# Patient Record
Sex: Male | Born: 1955 | Race: White | Hispanic: No | Marital: Married | Smoking: Former smoker
Health system: Southern US, Community
[De-identification: ages and names within clinical notes are randomized; demographics above are authoritative.]

## PROBLEM LIST (undated history)

## (undated) DIAGNOSIS — C801 Malignant (primary) neoplasm, unspecified: Secondary | ICD-10-CM

## (undated) DIAGNOSIS — Z951 Presence of aortocoronary bypass graft: Secondary | ICD-10-CM

## (undated) DIAGNOSIS — K633 Ulcer of intestine: Secondary | ICD-10-CM

## (undated) DIAGNOSIS — K635 Polyp of colon: Secondary | ICD-10-CM

## (undated) DIAGNOSIS — R319 Hematuria, unspecified: Secondary | ICD-10-CM

## (undated) DIAGNOSIS — D136 Benign neoplasm of pancreas: Secondary | ICD-10-CM

## (undated) DIAGNOSIS — I214 Non-ST elevation (NSTEMI) myocardial infarction: Secondary | ICD-10-CM

## (undated) DIAGNOSIS — I10 Essential (primary) hypertension: Secondary | ICD-10-CM

## (undated) DIAGNOSIS — I2510 Atherosclerotic heart disease of native coronary artery without angina pectoris: Secondary | ICD-10-CM

## (undated) DIAGNOSIS — N189 Chronic kidney disease, unspecified: Secondary | ICD-10-CM

## (undated) DIAGNOSIS — M5030 Other cervical disc degeneration, unspecified cervical region: Secondary | ICD-10-CM

## (undated) DIAGNOSIS — E785 Hyperlipidemia, unspecified: Secondary | ICD-10-CM

## (undated) DIAGNOSIS — E119 Type 2 diabetes mellitus without complications: Secondary | ICD-10-CM

## (undated) HISTORY — PX: SPLENECTOMY: SUR1306

## (undated) HISTORY — DX: Ulcer of intestine: K63.3

## (undated) HISTORY — PX: DISTAL PANCREATECTOMY: SHX1468

## (undated) HISTORY — DX: Type 2 diabetes mellitus without complications: E11.9

## (undated) HISTORY — DX: Chronic kidney disease, unspecified: N18.9

## (undated) HISTORY — DX: Hematuria, unspecified: R31.9

## (undated) HISTORY — DX: Other cervical disc degeneration, unspecified cervical region: M50.30

## (undated) HISTORY — DX: Hyperlipidemia, unspecified: E78.5

## (undated) HISTORY — DX: Polyp of colon: K63.5

## (undated) HISTORY — DX: Malignant (primary) neoplasm, unspecified: C80.1

## (undated) HISTORY — DX: Essential (primary) hypertension: I10

---

## 1981-10-11 HISTORY — PX: KNEE SURGERY: SHX244

## 1999-10-12 HISTORY — PX: LUMBAR LAMINECTOMY: SHX95

## 2000-09-27 ENCOUNTER — Ambulatory Visit (HOSPITAL_BASED_OUTPATIENT_CLINIC_OR_DEPARTMENT_OTHER): Admission: RE | Admit: 2000-09-27 | Discharge: 2000-09-27 | Payer: Self-pay | Admitting: General Surgery

## 2000-10-11 HISTORY — PX: LUMBAR FUSION: SHX111

## 2007-02-23 ENCOUNTER — Encounter: Admission: RE | Admit: 2007-02-23 | Discharge: 2007-02-23 | Payer: Self-pay | Admitting: Emergency Medicine

## 2007-06-05 ENCOUNTER — Ambulatory Visit (HOSPITAL_COMMUNITY): Admission: RE | Admit: 2007-06-05 | Discharge: 2007-06-05 | Payer: Self-pay | Admitting: Surgery

## 2007-08-07 ENCOUNTER — Encounter: Admission: RE | Admit: 2007-08-07 | Discharge: 2007-08-07 | Payer: Self-pay | Admitting: Orthopedic Surgery

## 2008-10-11 HISTORY — PX: REVISION TOTAL HIP ARTHROPLASTY: SHX766

## 2009-09-18 HISTORY — PX: COLONOSCOPY: SHX174

## 2011-02-23 NOTE — Op Note (Signed)
Timothy Gentry, Timothy Gentry               ACCOUNT NO.:  [id number]   MEDICAL RECORD NO.:  [id number]          PATIENT TYPE:  AMB   LOCATION:  DAY                          FACILITY:  [facility]   PHYSICIAN:  Sandria Bales. Ezzard Standing, M.D.  DATE OF BIRTH:  Jul 03, 1956   DATE OF PROCEDURE:  06/05/2007  DATE OF DISCHARGE:                               OPERATIVE REPORT   PREOPERATIVE DIAGNOSIS:  Bilateral inguinal hernias.   POSTOPERATIVE DIAGNOSIS:  Bilateral direct inguinal hernias, left larger  than right.   PROCEDURE:  Bilateral laparoscopic inguinal hernia repair, with 3-D  Davol mesh.   SURGEON:  Sandria Bales. Ezzard Standing, M.D.   ANESTHESIA:  General endotracheal.   ESTIMATED BLOOD LOSS:  Minimal.   PROCEDURE:  Timothy Gentry is a 55 year old white male, a patient of Dr.  Leslee Home, who has had bilateral inguinal hernias (the larger one on  the left side).  These have both been repaired before, most recently by  Francina Ames, M.D. in December 2001; who used an onlay Marlex mesh.  He  now comes for attempted laparoscopic inguinal hernia repair.   The indications and potential complications of the procedure were  explained to the patient.  Potential complications include, but not  limited to:  bleeding, infection, nerve injury and recurrence of the  hernia.   OPERATIVE NOTE:  Patient was placed in the supine position, given a  general anesthetic.  He was given 1 gram of Ancef initially at this  procedure.  A time-out was held, identifying both the procedure and the  patient.   He had a Foley placed, and his lower abdomen was shaved, prepped with  Betadine solution and sterilely draped.   An infraumbilical incision was made with sharp dissection carried down  into the rectus abdominis muscle.  I made an incision through the  anterior rectus abdominis on the left side and retracted the rectus  abdominal muscle anteriorly; and used the [place] surgical preperitoneal  balloon to dissect down to his pubic  tubercle.   I then blew this balloon up under direct laparoscopic visualization,  identifying the peritoneum being pushed posteriorly and the rectus  abdominis muscle anteriorly.  After the balloon had a pretty good  dissection, I then removed the balloon.  I placed two 5 mm trocars; one  in the left lower and one in the right lower quadrant.   I then carried out the dissection; identified the pubic bone and  Cooper's ligament, identified the inferior epigastric vessels.  He had  bilateral inguinal hernias.  There was a fairly large direct hernia on  the left side, and a kind of a lateral inguinal floor small hernia on  the right side and the lateral inguinal floor.  Both were direct  hernias.  I identified again his inferior epigastric vessels.  I  skeletonized his cord structures on both sides.  He had no evidence of  an indirect hernia.  I did encircle both  cord structures and identified  the peritoneum  along the cord structures on both sides.   I then carried out the bilateral inguinal floor repair  using 2 pieces of  Davol  3-D mesh.  I tacked the mesh on both sides in a mirror fashion to  the pubic tubercle medially, Cooper's ligament inferiorly, transversalis  fascia superiorly and laterally.  I avoided the area lateral to the  iliac vessels and inferior to the iliopubic tract.   The mesh on both sides lay flat and covered the defect well.  Then,  prior to deflating the abdomen, I held the corners of the mesh where I  could not put tacks.   I then removed the trocars in turn, closed the anterior fascia with a 2-  0 Vicryl suture on the left side.  I closed the skin with a 5-0 Vicryl.  Painted the wounds with the tincture of Benzoin and Steri-Strips.  The  sponge and needle count were correct.   The patient tolerated the procedure well and was transported to the  recovery room in good condition.      Sandria Bales. Ezzard Standing, M.D.  Electronically Signed     DHN/MEDQ  D:   06/05/2007  T:  06/05/2007  Job:  [id number]   cc:   Reuben Likes, M.D.  Fax: [phone]   Griffith Citron, M.D.  Fax: [phone]

## 2011-02-26 NOTE — Op Note (Signed)
[place]  Patient:    Timothy Gentry, Timothy Gentry                      MRN: [id number] Proc. Date: 09/27/00 Adm. Date:  [id number] Attending:  Janalyn Rouse                           Operative Report  PREOPERATIVE DIAGNOSIS:  Bilateral inguinal hernias with an epigastric hernia.  POSTOPERATIVE DIAGNOSIS:  Bilateral direct inguinal hernias with epigastric hernia.  PROCEDURE:  Repair of bilateral inguinal hernias with Marlex mesh and epigastric hernia with mesh.  SURGEON:  Rose Phi. Maple Hudson, M.D.  ANESTHESIA:  General.  DESCRIPTION OF PROCEDURE:  After suitable general anesthesia was induced, the patient was placed in the supine position and the entire abdomen prepped and draped in the usual fashion.  The right inguinal incision was approached first with an oblique incision down to the fascia of the external oblique. Hemostasis obtained with the cautery.  External oblique fascia was incised down through the external ring.  Spermatic cord was mobilized and a drain placed about it.  He had a fairly large direct sac, which I reduced, and then closed that defect with a 2-0 Vicryl suture just to hold it out of the way. There was a medium-sized lipoma of the cord, which we excised, and there was no indirect sac.  We then took an appropriately-sized Marlex mesh and placed it on the floor of the canal with the lateral margin incised to go around the cord.  We then sutured it in place in a tension-free fashion with interrupted 2-0 Prolene sutures, including suturing the tail of the mesh lateral to the cord.  External oblique fascia was then closed over the cord with running 3-0 Vicryl.  Vicryl 3-0 used in the subcutaneous tissue and staples for use in the skin.  Left side was then repaired basically in an identical fashion, and the only difference was that there was no lipoma of the cord and that the direct defect was somewhat smaller than on the right  side.  After completing the closure of the left side, then made a vertical incision above the umbilicus and dissected down to the fascia and opened up the hernia sac, which may have extended from the trocar site from his previous laparoscopic cholecystectomy.  We completely reduced the hernia and had to excise a little omentum.  With clean margins, I closed the fascia in a vertical fashion with interrupted 0 Prolene sutures, and then we took Marlex mesh and placed it on the fascia and sutured it in place with 2-0 Prolene. Following this, subcutaneous tissue was closed with 3-0 Vicryl and the skin with staples.  Dressings were then applied and the patient transferred to the recovery room in satisfactory condition, having tolerated the procedure well. DD:  09/27/00 TD:  09/28/00 Job: [id number]

## 2011-07-23 LAB — DIFFERENTIAL
Basophils Relative: 1
Eosinophils Absolute: 0.2
Lymphs Abs: 1.5
Monocytes Absolute: 0.5
Neutro Abs: 4.2

## 2011-07-23 LAB — COMPREHENSIVE METABOLIC PANEL
AST: 18
Albumin: 3.4 — ABNORMAL LOW
Alkaline Phosphatase: 70
BUN: 13
CO2: 28
GFR calc Af Amer: >60
GFR calc non Af Amer: 59 — ABNORMAL LOW
Sodium: 141
Total Bilirubin: 0.8

## 2011-07-23 LAB — CBC
Hemoglobin: 14.6
MCHC: 34.1
RBC: 5.32
WBC: 6.4

## 2014-03-15 ENCOUNTER — Other Ambulatory Visit: Payer: Self-pay | Admitting: Family Medicine

## 2014-03-15 DIAGNOSIS — R1904 Left lower quadrant abdominal swelling, mass and lump: Secondary | ICD-10-CM

## 2014-03-20 ENCOUNTER — Ambulatory Visit
Admission: RE | Admit: 2014-03-20 | Discharge: 2014-03-20 | Disposition: A | Discharge status: Home / Self Care | Payer: 59 | Source: Ambulatory Visit | Attending: Family Medicine | Admitting: Family Medicine

## 2014-03-20 ENCOUNTER — Other Ambulatory Visit: Payer: Self-pay | Admitting: Family Medicine

## 2014-03-20 DIAGNOSIS — R1904 Left lower quadrant abdominal swelling, mass and lump: Secondary | ICD-10-CM

## 2014-03-20 DIAGNOSIS — K869 Disease of pancreas, unspecified: Secondary | ICD-10-CM

## 2014-03-20 MED ORDER — IOHEXOL 300 MG/ML  SOLN
125.0000 mL | Freq: Once | INTRAMUSCULAR | Status: AC | PRN
  Administered 2014-03-20: 125 mL via INTRAVENOUS

## 2014-03-22 ENCOUNTER — Ambulatory Visit
Admission: RE | Admit: 2014-03-22 | Discharge: 2014-03-22 | Disposition: A | Discharge status: Home / Self Care | Payer: 59 | Source: Ambulatory Visit | Attending: Family Medicine | Admitting: Family Medicine

## 2014-03-22 DIAGNOSIS — K869 Disease of pancreas, unspecified: Secondary | ICD-10-CM

## 2014-03-22 MED ORDER — GADOBENATE DIMEGLUMINE 529 MG/ML IV SOLN
20.0000 mL | Freq: Once | INTRAVENOUS | Status: AC | PRN
  Administered 2014-03-22: 20 mL via INTRAVENOUS

## 2014-03-27 ENCOUNTER — Encounter (INDEPENDENT_AMBULATORY_CARE_PROVIDER_SITE_OTHER): Payer: Self-pay

## 2014-03-29 ENCOUNTER — Encounter (INDEPENDENT_AMBULATORY_CARE_PROVIDER_SITE_OTHER): Payer: Self-pay | Admitting: General Surgery

## 2014-03-29 ENCOUNTER — Other Ambulatory Visit (INDEPENDENT_AMBULATORY_CARE_PROVIDER_SITE_OTHER): Payer: Self-pay | Admitting: General Surgery

## 2014-03-29 ENCOUNTER — Ambulatory Visit (INDEPENDENT_AMBULATORY_CARE_PROVIDER_SITE_OTHER): Payer: 59 | Admitting: General Surgery

## 2014-03-29 VITALS — BP 130/90 | HR 86 | Temp 97.8°F | Resp 16 | Ht 70.0 in | Wt 242.6 lb

## 2014-03-29 DIAGNOSIS — C252 Malignant neoplasm of tail of pancreas: Secondary | ICD-10-CM

## 2014-03-29 DIAGNOSIS — D136 Benign neoplasm of pancreas: Secondary | ICD-10-CM | POA: Insufficient documentation

## 2014-03-29 DIAGNOSIS — K8689 Other specified diseases of pancreas: Secondary | ICD-10-CM

## 2014-03-29 HISTORY — DX: Benign neoplasm of pancreas: D13.6

## 2014-03-29 NOTE — Assessment & Plan Note (Signed)
Will get CT chest, CA 19-9, and endoscopic ultrasound.    Will also schedule surgery in order to avoid delays This does appear to be a pancreatic cancer.    I reviewed surgery with patient.  I discussed the procedure, as well as the risks.  I reviewed the risks of recurrent cancer, metastatic cancer requiring surgery abort.  I discussed the risks of panc leak, damage to adjacent structures, worsening of diabetes, need for panc enzyme supplementation, possible need for additional procedures/chemo/radiation.    45 min spent in evaluation, examination, counseling, and coordination of care.  >50% spent in counseling.

## 2014-03-29 NOTE — Progress Notes (Signed)
Chief Complaint  Patient presents with  . Mass    pancreas    HISTORY: Patient is a 58 year old male who presents with an incidentally discovered malignant appearing pancreatic tail mass. He is here for consultation at the request of Dr. Sandi Mariscal.  He felt a soft abdominal wall mass when he stood up that was just lateral to a previous umbilical hernia repair. This has been there for several years. He finally brought him to his primary care physician who ordered a CT scan. Unfortunately, the CT scan demonstrated a possible lesion in the pancreatic tail. An MRI was requested for better lineation of this region. MRI did confirm that there was a mass that appeared to be invading the splenic vein.  He is completely asymptomatic. He has no nausea, vomiting, early satiety, weight loss, or diarrhea. He has had a slight difficulty with his blood sugars in the past 2-3 years. He has been started on metformin.  His father had multiple myeloma, but he has no other family history of cancer. He is a former smoker. He drinks around one to 2 beers per week. He does not use any drugs. He does office work.  Past Medical History  Diagnosis Date  . Hematuria   . Diabetes mellitus without complication     Type II  . Hyperlipidemia   . Hypertension     Not adequately controlled  . Colon polyps   . Degenerative disc disease, cervical   . Chronic kidney disease     Chronic, Stage III  . Colon ulcer     Possible NSAID induced  . Cancer     skin    Past Surgical History  Procedure Laterality Date  . Revision total hip arthroplasty  2010  . Colon surgery  09/18/2009    colonoscopy  . Knee surgery Right 1983    Open repair of meniscus of knee  . Lumbar laminectomy  2001  . Lumbar fusion  2002    Current Outpatient Prescriptions  Medication Sig Dispense Refill  . allopurinol (ZYLOPRIM) 100 MG tablet Take 100 mg by mouth daily.      Marland Kitchen allopurinol (ZYLOPRIM) 300 MG tablet Take 300 mg by mouth daily.       Marland Kitchen aspirin 81 MG tablet Take 81 mg by mouth daily.      . metFORMIN (GLUCOPHAGE-XR) 500 MG 24 hr tablet       . Multiple Vitamin (MULTIVITAMIN) tablet Take 1 tablet by mouth daily.      . pravastatin (PRAVACHOL) 80 MG tablet       . valsartan-hydrochlorothiazide (DIOVAN-HCT) 320-25 MG per tablet Take 1 tablet by mouth daily.       No current facility-administered medications for this visit.     No Known Allergies   Family History  Problem Relation Age of Onset  . Cancer Father     myoloma     History   Social History  . Marital Status: Married    Spouse Name: N/A    Number of Children: N/A  . Years of Education: N/A   Social History Main Topics  . Smoking status: Former Smoker    Types: Cigarettes    Quit date: 03/30/1979  . Smokeless tobacco: None  . Alcohol Use: Yes  . Drug Use: No  . Sexual Activity: None   Other Topics Concern  . None   Social History Narrative  . None     REVIEW OF SYSTEMS - PERTINENT POSITIVES ONLY: 12 point review of  systems negative other than HPI and PMH.  EXAM: Filed Vitals:   03/29/14 0938  BP: 130/90  Pulse: 86  Temp: 97.8 F (36.6 C)  Resp: 16    Wt Readings from Last 3 Encounters:  03/29/14 242 lb 9.6 oz (110.043 kg)     Gen:  No acute distress.  Well nourished and well groomed.   Neurological: Alert and oriented to person, place, and time. Coordination normal.  Head: Normocephalic and atraumatic.  Eyes: Conjunctivae are normal. Pupils are equal, round, and reactive to light. No scleral icterus.  Neck: Normal range of motion. Neck supple. No tracheal deviation or thyromegaly present.  Cardiovascular: Normal rate, regular rhythm, normal heart sounds and intact distal pulses.  Exam reveals no gallop and no friction rub.  No murmur heard. Respiratory: Effort normal.  No respiratory distress. No chest wall tenderness. Breath sounds normal.  No wheezes, rales or rhonchi.  GI: Soft. Bowel sounds are normal. The abdomen is  soft and nontender.  There is no rebound and no guarding.  There is a soft mass appreciable in the left mid abdomen around 8 cm while he is standing.  .  This is fatty.  It is less appreciable upon lying down.  Musculoskeletal: Normal range of motion. Extremities are nontender.  Lymphadenopathy: No cervical, preauricular, postauricular or axillary adenopathy is present Skin: Skin is warm and dry. No rash noted. No diaphoresis. No erythema. No pallor. No clubbing, cyanosis, or edema.   Psychiatric: Normal mood and affect. Behavior is normal. Judgment and thought content normal.    LABORATORY RESULTS: Available labs are reviewed   No results found for this or any previous visit (from the past 2160 hour(s)).   RADIOLOGY RESULTS: See E-Chart or I-Site for most recent results.  Images and reports are reviewed.  Mr Abdomen W Wo Contrast  03/22/2014   CLINICAL DATA:  Evaluate mass within tail of pancreas  EXAM: MRI ABDOMEN WITHOUT AND WITH CONTRAST  TECHNIQUE: Multiplanar multisequence MR imaging of the abdomen was performed both before and after the administration of intravenous contrast.  CONTRAST:  57m MULTIHANCE GADOBENATE DIMEGLUMINE 529 MG/ML IV SOLN  COMPARISON:  CT 03/20/2014  FINDINGS: Mass within the tail of pancreas is identified:  Size: 2.7 x 3.7 x 2.7  Location: Involves the tail of pancreas.  Characterization: Predominantly solid with internal cystic component measuring 1.1 cm.  Enhancement: This exhibits hyper to isointense enhancement compared to adjacent normal pancreas parenchyma.  Other Characteristics: None  Local extent of mass: None  Vascular Involvement: This abuts the distal portion of the splenic vein. There is no involvement of the SMA, celiac artery or portal vein.  Variant hepatic artery anatomy: None  Bile Duct Involvement: None  Variant biliary anatomy: Not applicable  Adjacent Nodes: None  Omental/Peritoneal Disease: None  Distant Metastases: None  Other: No focal liver  abnormalities identified. Prior cholecystectomy. The spleen appears normal. The adrenal glands are both normal. Bosniak category 1 cysts are identified within the upper pole of the left kidney. Stone within the upper pole of the left kidney is again noted.  Normal caliber of the abdominal aorta.  No aneurysm.  IMPRESSION: 1. Suspicious mass within tail of pancreas is concerning for pancreatic adenocarcinoma. 2. No evidence for metastatic disease.   Electronically Signed   By: TKerby MoorsM.D.   On: 03/22/2014 13:11   Ct Abdomen Pelvis W Contrast  03/20/2014   CLINICAL DATA:  Periumbilical mass.  EXAM: CT ABDOMEN AND PELVIS WITH CONTRAST  TECHNIQUE: Multidetector CT imaging of the abdomen and pelvis was performed using the standard protocol following bolus administration of intravenous contrast.  CONTRAST:  163m OMNIPAQUE IOHEXOL 300 MG/ML  SOLN  COMPARISON:  None.  FINDINGS: Liver normal. Spleen normal. Questionable low-density lesion in the tail of the pancreas noted, image number 8/series 5. MRI of the pancreas is suggested for further evaluation. No biliary distention. Surgical clips gallbladder fossa.  Adrenals normal. Bilateral nonobstructing nephrolithiasis. Multiple simple cyst left kidney. No evidence of ureteral stone. The bladder is nondistended. Calcification of the prostate. No pelvic mass or fluid collection.  No significant inguinal or retroperitoneal lymph nodes are noted. Abdominal aorta is widely patent. Visceral vessels are patent.  Appendix normal. No inflammatory change in the right or left lower quadrant. Stool is present colon. Colonic diverticulosis. No evidence of diverticulitis. No free air. No mesenteric mass. Anterior abdominal wall hernia to the left of midline is noted. There is herniation of fat only. Bilateral inguinal hernias with herniation of fat noted.  Mild basilar atelectasis. Mild cardiomegaly. Coronary artery disease cannot be excluded. No acute bony abnormalities.  Postsurgical changes lumbar spine and left hip. Degenerative changes lumbar spine.  IMPRESSION: 1. Anterior abdominal wall hernia to the left of midline with herniation of fat only. Small bilateral inguinal hernias.  2. Possible pancreatic tail mass. MRI of the pancreas suggest for further evaluation .  3.  Mild cardiomegaly.  Coronary artery disease cannot be excluded .   Electronically Signed   By: TMarcello Moores Register   On: 03/20/2014 13:24      ASSESSMENT AND PLAN: Malignant neoplasm of tail of pancreas Will get CT chest, CA 19-9, and endoscopic ultrasound.    Will also schedule surgery in order to avoid delays This does appear to be a pancreatic cancer.    I reviewed surgery with patient.  I discussed the procedure, as well as the risks.  I reviewed the risks of recurrent cancer, metastatic cancer requiring surgery abort.  I discussed the risks of panc leak, damage to adjacent structures, worsening of diabetes, need for panc enzyme supplementation, possible need for additional procedures/chemo/radiation.    45 min spent in evaluation, examination, counseling, and coordination of care.  >50% spent in counseling.       FMilus HeightMD Surgical Oncology, General [facility]      Visit Diagnoses: 1. Malignant neoplasm of tail of pancreas     Primary Care Physician: BMarylene Land MD

## 2014-03-29 NOTE — Patient Instructions (Signed)
We will order CT chest, CA 19-9, and endoscopic ultrasound.    I will see you back in around 2-3 weeks.    I will go ahead and try to find a surgical time.  We can cancel or change this if we need to, or if you desire.  However, it will be much later if we don't go ahead and get a date.    Pancreas Surgery  THE OPERATION: The goal of the operation is to remove masses in the tail of the pancreas.  The distal pancreas and spleen will be removed.  The reason so much needs to be removed is that the blood supply and lymphatic channels and nodes are closely interconnected.  The operation takes between 2-4 hours depending on the amount of scar tissue you have present from prior surgeries, or from the mass.    WHAT HAPPENS IN THE OPERATING ROOM: I will start the operation with a diagnostic laparoscopy.  This means we will make small incisions to see if there is visible cancer outside of the pancreas.  If there is visible spread of cancer, I will stop the operation because research has shown that survival for pancreatic cancer is worse if you undergo a big operation without being able to remove all the cancer.  If no other cancer is seen, I will proceed with the rest of the operation.  You will have an incision in the midline around 3-4 inches, and several other incisions in the left upper abdomen.  Whatever the operative findings, I will discuss the case with your family after we are done in the operating room.  I will talk to you in the next few days when you are more awake.  I will see you in the hospital every weekday that I am not out of town.  My partners help see patients on the weekends and if I am out of town.    WHAT HAPPENS AFTER SURGERY: After surgery, you will go first to the recovery room,then to an appropriate level of room.  You will have many tubes and lines in place which is standard for this operation.   You will have a tube in your nose for 1-2 days in order to suction out the stomach.   YOU WILL NOT BE ABLE TO EAT FOR SEVERAL DAYS AFTER SURGERY.  You will have a catheter in your bladder.  On your abdomen, you will have a surgical drains and possibly a pain pump with numbing medicine.  You will have compression stockings on your legs to decrease the risk of blood clots.    We will address your pain in several ways.  We will use an IV pain pump called a "PCA," or Patient Controlled Analgesia.  This allows you to press a button and immediately receive a dose of pain medication without waiting for a nurse.  We also use IV Tylenol and sometimes IV Toradol which is similar to ibuprofen.  You may also have a pump with numbing medicine delivered directly to your incision.  I use doses and medications that work for the majority of people, but you may need an adjustment to the dose or type of medicine if your pain is not adequately controlled.  Your throat may be sore, in which case you may need a throat spray or lozenges.    We will ask you to get out of bed the day after surgery in order to maximize your chances of not having complications.  Your risk of  pneumonia and blood clots is lower with walking and sitting in the chair.  We will also ask you to perform breathing exercises.  We will also ask to you walk in your room and in the halls for the above reasons, but also in order for you to keep up your strength.    EATING: We will usually start you on clear liquids in around 1-2 days if your bowel function seems to have returned.  We advance your diet slowly to make sure you are tolerating each step.  All patients do not have a normal appetite when they go home Most patients also find that their taste buds do not seem the same right after surgery, and this can continue into the time of possible post operative chemotherapy and radiation.  Some patients develop diabetes and will need assistance from a primary care doctor for medication.    OTHER TREATMENT? I will present your case when the  pathology is available at our GI Multidisciplinary conference which occurs every 1-2 weeks.  Medical and Radiation Oncologists are present, as well as the pathologist and radiologist in addition to myself.  If you have a larger tumor or if you have positive lymph nodes, we may have the oncologist stop by to see you while you are in the hospital.  Most firm post op treatment plans occur, however, once you are an outpatient because we will need to see how you are recovering from surgery.  GOING HOME! Usually you are able to go home in 3-7 days, depending on whether or not complications happen and what is going on with your overall health status.   If you have more health problems or if you have limited help at home, the therapists and nurses may recommend a temporary rehab or nursing facility to help you get back on your feet before you go home.  These decisions would be made while you are in the hospital with the assistance of a social worker or case manager.    Please bring all insurance/disability forms to our office for the staff to fill out   POSSIBLE COMPLICATIONS This is a very extensive operation and includes complications listed below: Bleeding Infection and possible wound complications such as hernia Damage to adjacent structures Pancreatic leak (10%) Possible need for other procedures, such as abscess drains in radiology or endoscopy.   Possible prolonged hospital stay Possible development of diabetes or worsening of current diabetes. (5-20%) Possible diarrhea from lack of pancreatic enzymes.   Prolonged fatigue/weakness/appetite MOST PATIENTS' ENERGY LEVEL IS NOT BACK TO NORMAL FOR AT LEAST 2-3 MONTHS.  OLDER PATIENTS MAY FEEL WEAK FOR LONGER PERIODS OF TIME.   Possible early recurrence of cancer Possible complications of your medical problems such as heart disease or arrhythmias. Death (less than 1%)  All possible complications are not listed, just the most common.    FURTHER  INFORMATION? Please ask questions if you find something that we did not discuss in the office and would like more information.  If you would like another appointment if you have many questions or if your family members would like to come as well, please contact the office.    IF YOU ARE TAKING ASPIRIN, PLAVIX, COUMADIN, OR OTHER BLOOD THINNERS, LET us KNOW IMMEDIATELY SO WE CAN CONTACT YOUR PRESCRIBING HEALTH CARE PROVIDER TO HOLD THE MEDICATION FOR 5-7 DAYS BEFORE SURGERY

## 2014-04-02 ENCOUNTER — Telehealth: Payer: Self-pay | Admitting: Gastroenterology

## 2014-04-02 ENCOUNTER — Other Ambulatory Visit: Payer: Self-pay

## 2014-04-02 DIAGNOSIS — K8689 Other specified diseases of pancreas: Secondary | ICD-10-CM

## 2014-04-02 NOTE — Telephone Encounter (Signed)
Per [facility] pt appt has been moved to 04/25/14 the pt is out of town, instructions will be mailed to the pt home

## 2014-04-02 NOTE — Telephone Encounter (Signed)
Left message on machine to call back  

## 2014-04-02 NOTE — Telephone Encounter (Signed)
Pt has been scheduled and EUS instructions have been mailed to the home. Anderson Malta with [facility] will have pt call me when she speaks to him.

## 2014-04-02 NOTE — Telephone Encounter (Signed)
Patty, he needs upper EUS, radial + linear, this Thursday, ++MAC, for pancreatic tail mass  Thanks

## 2014-04-02 NOTE — Telephone Encounter (Signed)
Pt needs instructions for 04/04/14 EUS

## 2014-04-03 ENCOUNTER — Telehealth: Payer: Self-pay | Admitting: Gastroenterology

## 2014-04-03 NOTE — Telephone Encounter (Signed)
Ok, thanks  Dillwyn, I'm happy to reschedule EUS if he changes his mind

## 2014-04-03 NOTE — Telephone Encounter (Signed)
The appt has been cx, the has notified Dr Barry Dienes he would only state that he was going to do something else.

## 2014-04-15 ENCOUNTER — Encounter (INDEPENDENT_AMBULATORY_CARE_PROVIDER_SITE_OTHER): Payer: 59 | Admitting: General Surgery

## 2014-04-16 ENCOUNTER — Other Ambulatory Visit: Payer: 59

## 2014-04-25 ENCOUNTER — Encounter (HOSPITAL_COMMUNITY): Admission: RE | Payer: Self-pay | Source: Ambulatory Visit

## 2014-04-25 ENCOUNTER — Ambulatory Visit (HOSPITAL_COMMUNITY): Admission: RE | Admit: 2014-04-25 | Payer: 59 | Source: Ambulatory Visit | Admitting: Gastroenterology

## 2014-04-25 SURGERY — UPPER ENDOSCOPIC ULTRASOUND (EUS) LINEAR
Anesthesia: Monitor Anesthesia Care

## 2014-04-29 ENCOUNTER — Inpatient Hospital Stay (HOSPITAL_COMMUNITY): Admission: RE | Admit: 2014-04-29 | Payer: 59 | Source: Ambulatory Visit | Admitting: General Surgery

## 2014-04-29 ENCOUNTER — Encounter (HOSPITAL_COMMUNITY): Admission: RE | Payer: Self-pay | Source: Ambulatory Visit

## 2014-04-29 SURGERY — PANCREATECTOMY, LAPAROSCOPIC
Anesthesia: General

## 2014-05-17 ENCOUNTER — Encounter (INDEPENDENT_AMBULATORY_CARE_PROVIDER_SITE_OTHER): Payer: 59 | Admitting: General Surgery

## 2014-09-22 ENCOUNTER — Inpatient Hospital Stay (HOSPITAL_COMMUNITY)
Admission: EM | Admit: 2014-09-22 | Discharge: 2014-09-24 | DRG: 379 | Disposition: A | Discharge status: Home / Self Care | Payer: 59 | Attending: Internal Medicine | Admitting: Internal Medicine

## 2014-09-22 ENCOUNTER — Encounter (HOSPITAL_COMMUNITY): Payer: Self-pay | Admitting: *Deleted

## 2014-09-22 ENCOUNTER — Emergency Department (HOSPITAL_COMMUNITY): Payer: 59

## 2014-09-22 DIAGNOSIS — E119 Type 2 diabetes mellitus without complications: Secondary | ICD-10-CM | POA: Diagnosis present

## 2014-09-22 DIAGNOSIS — I129 Hypertensive chronic kidney disease with stage 1 through stage 4 chronic kidney disease, or unspecified chronic kidney disease: Secondary | ICD-10-CM | POA: Diagnosis present

## 2014-09-22 DIAGNOSIS — Z9049 Acquired absence of other specified parts of digestive tract: Secondary | ICD-10-CM | POA: Diagnosis present

## 2014-09-22 DIAGNOSIS — I959 Hypotension, unspecified: Secondary | ICD-10-CM | POA: Diagnosis present

## 2014-09-22 DIAGNOSIS — K449 Diaphragmatic hernia without obstruction or gangrene: Secondary | ICD-10-CM | POA: Diagnosis present

## 2014-09-22 DIAGNOSIS — Z7982 Long term (current) use of aspirin: Secondary | ICD-10-CM

## 2014-09-22 DIAGNOSIS — Z86010 Personal history of colonic polyps: Secondary | ICD-10-CM

## 2014-09-22 DIAGNOSIS — N183 Chronic kidney disease, stage 3 (moderate): Secondary | ICD-10-CM | POA: Diagnosis present

## 2014-09-22 DIAGNOSIS — Z79899 Other long term (current) drug therapy: Secondary | ICD-10-CM

## 2014-09-22 DIAGNOSIS — I10 Essential (primary) hypertension: Secondary | ICD-10-CM

## 2014-09-22 DIAGNOSIS — K625 Hemorrhage of anus and rectum: Principal | ICD-10-CM

## 2014-09-22 DIAGNOSIS — Z981 Arthrodesis status: Secondary | ICD-10-CM | POA: Diagnosis not present

## 2014-09-22 DIAGNOSIS — E875 Hyperkalemia: Secondary | ICD-10-CM | POA: Diagnosis present

## 2014-09-22 DIAGNOSIS — K648 Other hemorrhoids: Secondary | ICD-10-CM | POA: Diagnosis present

## 2014-09-22 DIAGNOSIS — R55 Syncope and collapse: Secondary | ICD-10-CM

## 2014-09-22 DIAGNOSIS — E785 Hyperlipidemia, unspecified: Secondary | ICD-10-CM

## 2014-09-22 DIAGNOSIS — Z87891 Personal history of nicotine dependence: Secondary | ICD-10-CM

## 2014-09-22 DIAGNOSIS — Z96649 Presence of unspecified artificial hip joint: Secondary | ICD-10-CM | POA: Diagnosis present

## 2014-09-22 LAB — URINALYSIS, ROUTINE W REFLEX MICROSCOPIC
Bilirubin Urine: NEGATIVE
Glucose, UA: NEGATIVE mg/dL
Hgb urine dipstick: NEGATIVE
Ketones, ur: NEGATIVE mg/dL
Leukocytes, UA: NEGATIVE
Nitrite: NEGATIVE
Protein, ur: NEGATIVE mg/dL
SPECIFIC GRAVITY, URINE: 1.011 (ref 1.005–1.030)
UROBILINOGEN UA: 0.2 mg/dL (ref 0.0–1.0)
pH: 6.5 (ref 5.0–8.0)

## 2014-09-22 LAB — TROPONIN I: Troponin I: <0.3 ng/mL (ref <0.30)

## 2014-09-22 LAB — COMPREHENSIVE METABOLIC PANEL
ALK PHOS: 75 U/L (ref 39–117)
ALT: 14 U/L (ref 0–53)
ANION GAP: 12 (ref 5–15)
AST: 23 U/L (ref 0–37)
Albumin: 3.1 g/dL — ABNORMAL LOW (ref 3.5–5.2)
BILIRUBIN TOTAL: 0.3 mg/dL (ref 0.3–1.2)
BUN: 26 mg/dL — AB (ref 6–23)
CHLORIDE: 101 meq/L (ref 96–112)
CO2: 23 meq/L (ref 19–32)
Calcium: 9.4 mg/dL (ref 8.4–10.5)
Creatinine, Ser: 1.07 mg/dL (ref 0.50–1.35)
GFR, EST AFRICAN AMERICAN: 87 mL/min — AB (ref >90)
GFR, EST NON AFRICAN AMERICAN: 75 mL/min — AB (ref >90)
GLUCOSE: 179 mg/dL — AB (ref 70–99)
POTASSIUM: 5.4 meq/L — AB (ref 3.7–5.3)
Sodium: 136 mEq/L — ABNORMAL LOW (ref 137–147)
Total Protein: 6.6 g/dL (ref 6.0–8.3)

## 2014-09-22 LAB — TYPE AND SCREEN
ABO/RH(D): A POS
ANTIBODY SCREEN: NEGATIVE

## 2014-09-22 LAB — CBC WITH DIFFERENTIAL/PLATELET
BASOS PCT: 0 % (ref 0–1)
Basophils Absolute: 0.1 10*3/uL (ref 0.0–0.1)
EOS PCT: 1 % (ref 0–5)
Eosinophils Absolute: 0.2 10*3/uL (ref 0.0–0.7)
HCT: 42 % (ref 39.0–52.0)
Hemoglobin: 13.3 g/dL (ref 13.0–17.0)
LYMPHS PCT: 15 % (ref 12–46)
Lymphs Abs: 2.5 10*3/uL (ref 0.7–4.0)
MCH: 26.2 pg (ref 26.0–34.0)
MCHC: 31.7 g/dL (ref 30.0–36.0)
MCV: 82.8 fL (ref 78.0–100.0)
MONO ABS: 1.2 10*3/uL — AB (ref 0.1–1.0)
Monocytes Relative: 7 % (ref 3–12)
NEUTROS PCT: 77 % (ref 43–77)
Neutro Abs: 12.8 10*3/uL — ABNORMAL HIGH (ref 1.7–7.7)
Platelets: 354 10*3/uL (ref 150–400)
RBC: 5.07 MIL/uL (ref 4.22–5.81)
RDW: 17 % — ABNORMAL HIGH (ref 11.5–15.5)
WBC: 16.7 10*3/uL — AB (ref 4.0–10.5)

## 2014-09-22 LAB — HEMOGLOBIN AND HEMATOCRIT, BLOOD
HCT: 37.6 % — ABNORMAL LOW (ref 39.0–52.0)
HCT: 35.7 % — ABNORMAL LOW (ref 39.0–52.0)
HEMATOCRIT: 36.8 % — AB (ref 39.0–52.0)
Hemoglobin: 12 g/dL — ABNORMAL LOW (ref 13.0–17.0)
Hemoglobin: 11.7 g/dL — ABNORMAL LOW (ref 13.0–17.0)
Hemoglobin: 11.3 g/dL — ABNORMAL LOW (ref 13.0–17.0)

## 2014-09-22 LAB — ABO/RH: ABO/RH(D): A POS

## 2014-09-22 LAB — POC OCCULT BLOOD, ED: FECAL OCCULT BLD: POSITIVE — AB

## 2014-09-22 LAB — MRSA PCR SCREENING: MRSA BY PCR: NEGATIVE

## 2014-09-22 MED ORDER — ONDANSETRON HCL 4 MG/2ML IJ SOLN: 4.0000 mg | Freq: Four times a day (QID) | INTRAMUSCULAR | Status: DC | PRN

## 2014-09-22 MED ORDER — SODIUM CHLORIDE 0.9 % IV SOLN
INTRAVENOUS | Status: DC
  Administered 2014-09-22: 13:00:00 via INTRAVENOUS

## 2014-09-22 MED ORDER — SODIUM CHLORIDE 0.9 % IV SOLN
Freq: Once | INTRAVENOUS | Status: AC
  Administered 2014-09-22: 06:00:00 via INTRAVENOUS

## 2014-09-22 MED ORDER — PRAVASTATIN SODIUM 80 MG PO TABS
80.0000 mg | ORAL_TABLET | Freq: Every evening | ORAL | Status: DC
  Administered 2014-09-22 – 2014-09-24 (×3): 80 mg via ORAL
  Filled 2014-09-22 (×3): qty 1

## 2014-09-22 MED ORDER — SODIUM CHLORIDE 0.9 % IJ SOLN
3.0000 mL | Freq: Two times a day (BID) | INTRAMUSCULAR | Status: DC
  Administered 2014-09-22 – 2014-09-24 (×5): 3 mL via INTRAVENOUS

## 2014-09-22 MED ORDER — HYDRALAZINE HCL 20 MG/ML IJ SOLN
10.0000 mg | Freq: Four times a day (QID) | INTRAMUSCULAR | Status: DC | PRN
  Administered 2014-09-22: 10 mg via INTRAVENOUS
  Filled 2014-09-22: qty 1

## 2014-09-22 MED ORDER — ALLOPURINOL 300 MG PO TABS
300.0000 mg | ORAL_TABLET | Freq: Every day | ORAL | Status: DC
  Administered 2014-09-22 – 2014-09-24 (×2): 300 mg via ORAL
  Filled 2014-09-22 (×2): qty 1
  Filled 2014-09-22: qty 3

## 2014-09-22 MED ORDER — ALLOPURINOL 150 MG HALF TABLET
150.0000 mg | ORAL_TABLET | Freq: Every day | ORAL | Status: DC
  Administered 2014-09-23 – 2014-09-24 (×2): 150 mg via ORAL
  Filled 2014-09-22: qty 2
  Filled 2014-09-22: qty 1

## 2014-09-22 MED ORDER — SODIUM CHLORIDE 0.9 % IV BOLUS (SEPSIS)
1000.0000 mL | Freq: Once | INTRAVENOUS | Status: AC
  Administered 2014-09-22: 1000 mL via INTRAVENOUS

## 2014-09-22 MED ORDER — SODIUM CHLORIDE 0.9 % IV SOLN: INTRAVENOUS | Status: AC

## 2014-09-22 MED ORDER — ALLOPURINOL 100 MG PO TABS: 150.0000 mg | ORAL_TABLET | Freq: Two times a day (BID) | ORAL | Status: DC

## 2014-09-22 MED ORDER — ONDANSETRON HCL 4 MG PO TABS: 4.0000 mg | ORAL_TABLET | Freq: Four times a day (QID) | ORAL | Status: DC | PRN

## 2014-09-22 MED ORDER — ACETAMINOPHEN 650 MG RE SUPP: 650.0000 mg | Freq: Four times a day (QID) | RECTAL | Status: DC | PRN

## 2014-09-22 MED ORDER — ACETAMINOPHEN 325 MG PO TABS: 650.0000 mg | ORAL_TABLET | Freq: Four times a day (QID) | ORAL | Status: DC | PRN

## 2014-09-22 NOTE — ED Notes (Signed)
Pt states he was using the bathroom around 1230am and has some bright red rectal bleeding and then went again around 0230 when he passed out and states he had bright red rectal bleeding again.

## 2014-09-22 NOTE — ED Notes (Signed)
Timothy Gentry, NT advised that pt BP dropped and felt like he was going to pass out. Pt placed in trendeleberg position by Dr Lita Mains. Pt BP returned to normal. Pt is A&O and in NAD. Pt sts that when he feels like he needs to have a BM, it makes him feel like he will pass out. Pt family at bedside

## 2014-09-22 NOTE — ED Notes (Signed)
Bed: IW80 Expected date: 09/22/14 Expected time: 5:07 AM Means of arrival: Ambulance Comments: Gi bleed

## 2014-09-22 NOTE — H&P (Signed)
Patient Demographics  Timothy Gentry, is a 58 y.o. male  MRN: [id number]   DOB - 1956/07/05  Admit Date - 09/22/2014  Outpatient Primary MD for the patient is Marylene Land, MD   With History of -  Past Medical History  Diagnosis Date  . Hematuria   . Diabetes mellitus without complication     Type II  . Hyperlipidemia   . Hypertension     Not adequately controlled  . Colon polyps   . Degenerative disc disease, cervical   . Chronic kidney disease     Chronic, Stage III  . Colon ulcer     Possible NSAID induced  . Cancer     skin      Past Surgical History  Procedure Laterality Date  . Revision total hip arthroplasty  2010  . Colon surgery  09/18/2009    colonoscopy  . Knee surgery Right 1983    Open repair of meniscus of knee  . Lumbar laminectomy  2001  . Lumbar fusion  2002    in for   Chief Complaint  Patient presents with  . Rectal Bleeding     HPI  [name]  is a 58 y.o. 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 SHEENT reports she woke up this a.m. going to the bathroom, where he had an episode of bright red blood per rectum, where he reports having syncope as well in the bathroom, patient denies any previous history of bright red blood per rectum, but there is history of diverticulosis, and hemoglobin has been stable at 13.3, patient was noticed to have episodes of hypotension in ED with systolic of blood pressure being 70, patient reports he had previous episode of syncope while his having drain pulled out in outpatient clinic in [facility] last July, where he reports he was admitted overnight with negative workup, he was told his vasovagal, denies any chest pain, shortness of breath, fever, chills, coffee-ground emesis.    Review of Systems    In addition  to the HPI above,  No Fever-chills, No Headache, No changes with Vision or hearing, No problems swallowing food or Liquids, No Chest pain, Cough or Shortness of Breath, No Abdominal pain, No Nausea or Vommitting, Bowel movements are regular, Report bright red blood per rectum No dysuria, No new skin rashes or bruises, No new joints pains-aches,  No new weakness, tingling, numbness in any extremity,had episode of syncope. No recent weight gain or loss, No polyuria, polydypsia or polyphagia, No significant Mental Stressors.  A full 10 point Review of Systems was done, except as stated above, all other Review of Systems were negative.   Social History History  Substance Use Topics  . Smoking status: Former Smoker    Types: Cigarettes    Quit date: 03/30/1979  . Smokeless tobacco: Not on file  . Alcohol Use: Yes     Family History Family History  Problem Relation Age of Onset  . Cancer Father     myoloma     Prior to Admission medications   Medication Sig Start Date End Date Taking? Authorizing Provider  allopurinol (ZYLOPRIM) 300 MG tablet Take 150-3,000 mg by mouth 2 (two) times daily. 150 mg in the morning and 300 mg in the evening.   Yes Historical Provider, MD  aspirin EC 81 MG tablet Take  81 mg by mouth daily.   Yes Historical Provider, MD  hydrochlorothiazide (HYDRODIURIL) 12.5 MG tablet Take 12.5 mg by mouth daily.   Yes Historical Provider, MD  losartan (COZAAR) 100 MG tablet Take 100 mg by mouth daily.   Yes Historical Provider, MD  metFORMIN (GLUCOPHAGE-XR) 500 MG 24 hr tablet Take 500 mg by mouth daily.  03/15/14  Yes Historical Provider, MD  Multiple Vitamin (MULTIVITAMIN) tablet Take 1 tablet by mouth daily.   Yes Historical Provider, MD  pravastatin (PRAVACHOL) 80 MG tablet Take 80 mg by mouth every evening.  03/15/14  Yes Historical Provider, MD    Allergies  Allergen Reactions  . Celebrex [Celecoxib] Other (See Comments)    Patient passed out    Physical  Exam  Vitals  Blood pressure 106/67, pulse 61, temperature 97.6 F (36.4 C), temperature source Oral, resp. rate 16, SpO2 98 %.   1. General well nourished male lying in bed in NAD,    2. Normal affect and insight, Not Suicidal or Homicidal, Awake Alert, Oriented X 3.  3. No F.N deficits, ALL C.Nerves Intact, Strength 5/5 all 4 extremities, Sensation intact all 4 extremities, Plantars down going.  4. Ears and Eyes appear Normal, Conjunctivae clear, PERRLA. Moist Oral Mucosa.  5. Supple Neck, No JVD, No cervical lymphadenopathy appriciated, No Carotid Bruits.  6. Symmetrical Chest wall movement, Good air movement bilaterally, CTAB.  7. RRR, No Gallops, Rubs or Murmurs, No Parasternal Heave.  8. Positive Bowel Sounds, Abdomen Soft, No tenderness, No organomegaly appriciated,No rebound -guarding or rigidity.  9.  No Cyanosis, Normal Skin Turgor, No Skin Rash or Bruise.  10. Good muscle tone,  joints appear normal , no effusions, Normal ROM.  11. No Palpable Lymph Nodes in Neck or Axillae    Data Review  CBC  Recent Labs Lab 09/22/14 0540  WBC 16.7*  HGB 13.3  HCT 42.0  PLT 354  MCV 82.8  MCH 26.2  MCHC 31.7  RDW 17.0*  LYMPHSABS 2.5  MONOABS 1.2*  EOSABS 0.2  BASOSABS 0.1   ------------------------------------------------------------------------------------------------------------------  Chemistries   Recent Labs Lab 09/22/14 0540  NA 136*  K 5.4*  CL 101  CO2 23  GLUCOSE 179*  BUN 26*  CREATININE 1.07  CALCIUM 9.4  AST 23  ALT 14  ALKPHOS 75  BILITOT 0.3   ------------------------------------------------------------------------------------------------------------------ CrCl cannot be calculated (Unknown ideal weight.). ------------------------------------------------------------------------------------------------------------------ No results for input(s): TSH, T4TOTAL, T3FREE, THYROIDAB in the last 72 hours.  Invalid input(s):  FREET3   Coagulation profile No results for input(s): INR, PROTIME in the last 168 hours. ------------------------------------------------------------------------------------------------------------------- No results for input(s): DDIMER in the last 72 hours. -------------------------------------------------------------------------------------------------------------------  Cardiac Enzymes  Recent Labs Lab 09/22/14 0540  TROPONINI <0.30   ------------------------------------------------------------------------------------------------------------------ Invalid input(s): POCBNP   ---------------------------------------------------------------------------------------------------------------  Urinalysis No results found for: COLORURINE, APPEARANCEUR, LABSPEC, PHURINE, GLUCOSEU, HGBUR, BILIRUBINUR, KETONESUR, PROTEINUR, UROBILINOGEN, NITRITE, LEUKOCYTESUR  ----------------------------------------------------------------------------------------------------------------  Imaging results:   Dg Abd Acute W/chest  09/22/2014   CLINICAL DATA:  58 year old male with bright red blood per rectum  EXAM: ACUTE ABDOMEN SERIES (ABDOMEN 2 VIEW & CHEST 1 VIEW)  COMPARISON:  Pancreatic MRI 03/22/2014; CT abdomen/ pelvis 03/20/2014, prior chest x-ray 06/05/2007  FINDINGS: The lungs are clear and negative for focal airspace consolidation, pulmonary edema or suspicious pulmonary nodule. No pleural effusion or pneumothorax. Cardiac and mediastinal contours are within normal limits. No acute fracture or lytic or blastic osseous lesions. The visualized upper abdominal bowel gas pattern is unremarkable.  No free  air on the upright view. The bowel gas pattern is nonobstructive. Gas is noted throughout the colon. Multiple radio opacities project over the bilateral renal shadows consistent with nephrolithiasis. On the right, there are at least 2 stones including a 6 mm stone in the interpolar collecting system and a  13 mm stone in the lower pole. On the left, there is a 21 mm partial staghorn calculus in the region of the upper pole infundibulum. Smaller stones are noted just inferior to this. Incompletely evaluated posterior lumbar fixation hardware extending from L4 through S1. Normal bony mineralization. Surgical clips in the right upper quadrant suggest prior cholecystectomy. Laparoscopic hernia tacks noted overlying the lower abdomen. Incompletely imaged left hip prosthesis.  IMPRESSION: 1. Negative chest x-ray. 2. No evidence of obstruction or free air. 3. Bilateral nephrolithiasis.   Electronically Signed   By: Jacqulynn Cadet M.D.   On: 09/22/2014 07:29        Assessment & Plan  Active Problems:   Bright red blood per rectum   Syncope   HTN (hypertension)   Hyperlipemia    Bright red blood per rectum/lower GI bleed: -This is most likely secondary to diverticulosis, will keep patient on clear liquid diet, monitor H&H every 6 hours, keep active type and screen, transfuse as needed. -Discussed the case with patient's GI physician Dr. Earlean Shawl, Burnis Medin continue to hold aspirin -SCDs for DVT prophylaxis.  Syncope -This is most likely vasovagal, patient will be admitted to step down giving his significant episodes of hypotension in ED, continue with IV fluids, check 2-D echo, check carotid Doppler, monitor on telemetry, cycle cardiac enzymes. - Hold his antihypertensive medication.  Hypertension -Hold all medication given his syncope and low blood pressure.  Hyperlipidemia -Continue with statin  History of a neuroendocrine neoplasm of pancreas. -Agent is following at [facility] .   DVT Prophylaxis SCDs  AM Labs Ordered, also please review Full Orders  Family Communication: Admission, patients condition and plan of care including tests being ordered have been discussed with the patient and WIFE AND SON who indicate understanding and agree with the plan and Code  Status.  Code Status Full  Admitted to step down  Condition GUARDED    Time spent in minutes : 60 minutes    Cheralyn Oliver M.D on 09/22/2014 at 10:02 AM  Between 7am to 7pm - Pager - [phone]  After 7pm go to [email or web address] - password [email or web address]  And look for the night coverage person covering me after hours  [facility] Office  [phone]   **Disclaimer: This note may have been dictated with voice recognition software. Similar sounding words can inadvertently be transcribed and this note may contain transcription errors which may not have been corrected upon publication of note.**

## 2014-09-22 NOTE — ED Notes (Signed)
Margo at bed placement sts that there are no beds at this time,but there are transfers pending. When they move, he will get that bed when it is cleaned. Family updated

## 2014-09-22 NOTE — ED Provider Notes (Signed)
CSN: [id number]     Arrival date & time 09/22/14  8182 History   First MD Initiated Contact with Patient 09/22/14 [phone]     Chief Complaint  Patient presents with  . Rectal Bleeding   HPI  Patient is a 58 year old male who presents emergency room for evaluation of rectal bleeding and syncopal episode. Patient states that last night he woke up at approximately 12:30 AM and had a bowel movement with a small amount of red in the toilet. He states that he had no pain with bowel movement. He went back to sleep. He woke up again at 2:30 this morning and had another bowel movement. He states that this bowel movement was looser than normal and had a significant amount of red blood mixed in with stool. While he was on the toilet he became sweaty, hot, nauseous, and then had a syncopal episode that was witnessed by his wife. His wife states that he was unresponsive for approximately 2 minutes. During that time she checked his blood pressure and found that it was 80/60. Patient came to and had no confusion or problems after syncopal episode. Patient denies any type of chest pain. Patient states that he has had a syncopal episode like this before several months ago after he had an abdominal surgery and had to have the drain removed. Patient is status post pancreatic tail removal and splenic to be. This was performed over at [facility]. He had a cardiac workup after this episode and they thought that it was likely a vagal episode. He denies any heart history. Patient does have diabetes, hyperlipidemia, and hypertension. Patient states that he has had 2 colonoscopies in the past. The first colonoscopy they found an ulcer in his colon and several polyps. Biopsies were negative for cancerous lesions. His second colonoscopy was negative for acute abnormalities. He states that he was recently called to schedule his next colonoscopy. He believes his last colonoscopy was approximately 5 years ago. Patient states that several  weeks ago he is having left lower quadrant pain that lasted for a couple days and then went away. Patient does state that he has been eating a lot of nuts despite knowing that he has diverticulosis.  Past Medical History  Diagnosis Date  . Hematuria   . Diabetes mellitus without complication     Type II  . Hyperlipidemia   . Hypertension     Not adequately controlled  . Colon polyps   . Degenerative disc disease, cervical   . Chronic kidney disease     Chronic, Stage III  . Colon ulcer     Possible NSAID induced  . Cancer     skin   Past Surgical History  Procedure Laterality Date  . Revision total hip arthroplasty  2010  . Colon surgery  09/18/2009    colonoscopy  . Knee surgery Right 1983    Open repair of meniscus of knee  . Lumbar laminectomy  2001  . Lumbar fusion  2002   Family History  Problem Relation Age of Onset  . Cancer Father     myoloma   History  Substance Use Topics  . Smoking status: Former Smoker    Types: Cigarettes    Quit date: 03/30/1979  . Smokeless tobacco: Not on file  . Alcohol Use: Yes    Review of Systems  Constitutional: Positive for diaphoresis. Negative for fever, chills and fatigue.  Respiratory: Negative for chest tightness and shortness of breath.   Cardiovascular: Negative  for chest pain, palpitations and leg swelling.  Gastrointestinal: Positive for abdominal pain (intermittent occasional cramping), diarrhea and blood in stool. Negative for nausea, vomiting, constipation and rectal pain.  Genitourinary: Negative for dysuria, urgency, frequency, hematuria and difficulty urinating.  Musculoskeletal: Negative for back pain.  Neurological: Positive for syncope and light-headedness.  All other systems reviewed and are negative.     Allergies  Celebrex  Home Medications   Prior to Admission medications   Medication Sig Start Date End Date Taking? Authorizing Provider  allopurinol (ZYLOPRIM) 300 MG tablet Take 150-3,000 mg by  mouth 2 (two) times daily. 150 mg in the morning and 300 mg in the evening.   Yes Historical Provider, MD  aspirin EC 81 MG tablet Take 81 mg by mouth daily.   Yes Historical Provider, MD  hydrochlorothiazide (HYDRODIURIL) 12.5 MG tablet Take 12.5 mg by mouth daily.   Yes Historical Provider, MD  losartan (COZAAR) 100 MG tablet Take 100 mg by mouth daily.   Yes Historical Provider, MD  metFORMIN (GLUCOPHAGE-XR) 500 MG 24 hr tablet Take 500 mg by mouth daily.  03/15/14  Yes Historical Provider, MD  Multiple Vitamin (MULTIVITAMIN) tablet Take 1 tablet by mouth daily.   Yes Historical Provider, MD  pravastatin (PRAVACHOL) 80 MG tablet Take 80 mg by mouth every evening.  03/15/14  Yes Historical Provider, MD   BP 106/67 mmHg  Pulse 61  Temp(Src) 97.6 F (36.4 C) (Oral)  Resp 16  SpO2 98% Physical Exam  Constitutional: He is oriented to person, place, and time. He appears well-developed and well-nourished. No distress.  HENT:  Head: Normocephalic and atraumatic.  Mouth/Throat: Oropharynx is clear and moist. No oropharyngeal exudate.  Eyes: Conjunctivae and EOM are normal. Pupils are equal, round, and reactive to light. No scleral icterus.  Neck: Normal range of motion. Neck supple. No JVD present. No thyromegaly present.  Cardiovascular: Normal rate, regular rhythm, normal heart sounds and intact distal pulses.  Exam reveals no gallop and no friction rub.   No murmur heard. Pulmonary/Chest: Effort normal and breath sounds normal. No respiratory distress. He has no wheezes. He has no rales. He exhibits no tenderness.  Abdominal: Soft. Bowel sounds are normal. He exhibits no distension and no mass. There is no tenderness. There is no rebound and no guarding.  Genitourinary:  Pilar Plate red blood with no problems stool on my glove after rectal exam. Normal tone. No palpable internal or external hemorrhoids. No palpable masses. No tenderness palpation.  Musculoskeletal: Normal range of motion.   Lymphadenopathy:    He has no cervical adenopathy.  Neurological: He is alert and oriented to person, place, and time. He has normal strength. No cranial nerve deficit or sensory deficit. Coordination normal.  Skin: Skin is warm and dry. He is not diaphoretic.  Psychiatric: He has a normal mood and affect. His behavior is normal. Judgment and thought content normal.  Nursing note and vitals reviewed.   ED Course  Procedures (including critical care time) Labs Review Labs Reviewed  CBC WITH DIFFERENTIAL - Abnormal; Notable for the following:    WBC 16.7 (*)    RDW 17.0 (*)    Neutro Abs 12.8 (*)    Monocytes Absolute 1.2 (*)    All other components within normal limits  COMPREHENSIVE METABOLIC PANEL - Abnormal; Notable for the following:    Sodium 136 (*)    Potassium 5.4 (*)    Glucose, Bld 179 (*)    BUN 26 (*)  Albumin 3.1 (*)    GFR calc non Af Amer 75 (*)    GFR calc Af Amer 87 (*)    All other components within normal limits  POC OCCULT BLOOD, ED - Abnormal; Notable for the following:    Fecal Occult Bld POSITIVE (*)    All other components within normal limits  TROPONIN I  URINALYSIS, ROUTINE W REFLEX MICROSCOPIC    Dg Abd Acute W/chest  09/22/2014   CLINICAL DATA:  58 year old male with bright red blood per rectum  EXAM: ACUTE ABDOMEN SERIES (ABDOMEN 2 VIEW & CHEST 1 VIEW)  COMPARISON:  Pancreatic MRI 03/22/2014; CT abdomen/ pelvis 03/20/2014, prior chest x-ray 06/05/2007  FINDINGS: The lungs are clear and negative for focal airspace consolidation, pulmonary edema or suspicious pulmonary nodule. No pleural effusion or pneumothorax. Cardiac and mediastinal contours are within normal limits. No acute fracture or lytic or blastic osseous lesions. The visualized upper abdominal bowel gas pattern is unremarkable.  No free air on the upright view. The bowel gas pattern is nonobstructive. Gas is noted throughout the colon. Multiple radio opacities project over the bilateral  renal shadows consistent with nephrolithiasis. On the right, there are at least 2 stones including a 6 mm stone in the interpolar collecting system and a 13 mm stone in the lower pole. On the left, there is a 21 mm partial staghorn calculus in the region of the upper pole infundibulum. Smaller stones are noted just inferior to this. Incompletely evaluated posterior lumbar fixation hardware extending from L4 through S1. Normal bony mineralization. Surgical clips in the right upper quadrant suggest prior cholecystectomy. Laparoscopic hernia tacks noted overlying the lower abdomen. Incompletely imaged left hip prosthesis.  IMPRESSION: 1. Negative chest x-ray. 2. No evidence of obstruction or free air. 3. Bilateral nephrolithiasis.   Electronically Signed   By: Jacqulynn Cadet M.D.   On: 09/22/2014 07:29     EKG Interpretation None      MDM   Final diagnoses:  Syncope  Rectal bleeding   Patient is a 58 year old male who presents emergency room with rectal bleeding and syncope. Physical exam reveals an alert and nontoxic-appearing male with no tenderness to the abdomen. There is frank red blood on rectal exam with no other abnormalities. CBC shows leukocytosis with stable hemoglobin. CMP reveals mild hyperkalemia but this is likely some hemolyzation. There are no other acute abnormalities on CMP. Hemoccult is positive. Troponin is negative. Chest x-ray is negative. There is no evidence for bowel obstruction or free air on abdominal x-ray. I spoke with Dr. Earlean Shawl who is aware of the patient and will be happy to assist the hospitalist. He can be reached at [phone]. I spoke with Dr. Waldron Labs who will admit the patient to stepdown. Patient was seen by and discussed with Dr. Lita Mains who agrees with the above workup and plan. I have also discussed the patient with Dr. Tomi Bamberger who agrees with the above workup and plan.    Cherylann Parr, PA-C 09/22/14 1601  Julianne Rice, MD 09/23/14 [phone]

## 2014-09-23 DIAGNOSIS — R55 Syncope and collapse: Secondary | ICD-10-CM

## 2014-09-23 LAB — CBC
HCT: 37 % — ABNORMAL LOW (ref 39.0–52.0)
Hemoglobin: 11.8 g/dL — ABNORMAL LOW (ref 13.0–17.0)
MCH: 26.3 pg (ref 26.0–34.0)
MCHC: 31.9 g/dL (ref 30.0–36.0)
MCV: 82.6 fL (ref 78.0–100.0)
PLATELETS: 334 10*3/uL (ref 150–400)
RBC: 4.48 MIL/uL (ref 4.22–5.81)
RDW: 16.4 % — AB (ref 11.5–15.5)
WBC: 11.4 10*3/uL — ABNORMAL HIGH (ref 4.0–10.5)

## 2014-09-23 LAB — BASIC METABOLIC PANEL
ANION GAP: 11 (ref 5–15)
BUN: 16 mg/dL (ref 6–23)
CO2: 24 mEq/L (ref 19–32)
CREATININE: 0.98 mg/dL (ref 0.50–1.35)
Calcium: 9.3 mg/dL (ref 8.4–10.5)
Chloride: 103 mEq/L (ref 96–112)
GFR calc Af Amer: >90 mL/min (ref >90)
GFR, EST NON AFRICAN AMERICAN: 89 mL/min — AB (ref >90)
Glucose, Bld: 110 mg/dL — ABNORMAL HIGH (ref 70–99)
Potassium: 4.1 mEq/L (ref 3.7–5.3)
Sodium: 138 mEq/L (ref 137–147)

## 2014-09-23 LAB — HEMOGLOBIN AND HEMATOCRIT, BLOOD
HCT: 38.1 % — ABNORMAL LOW (ref 39.0–52.0)
HCT: 40.4 % (ref 39.0–52.0)
HEMATOCRIT: 41.1 % (ref 39.0–52.0)
HEMOGLOBIN: 13.4 g/dL (ref 13.0–17.0)
Hemoglobin: 12.6 g/dL — ABNORMAL LOW (ref 13.0–17.0)
Hemoglobin: 13 g/dL (ref 13.0–17.0)

## 2014-09-23 MED ORDER — HYDROCHLOROTHIAZIDE 12.5 MG PO CAPS
12.5000 mg | ORAL_CAPSULE | Freq: Every day | ORAL | Status: DC
  Administered 2014-09-23 – 2014-09-24 (×2): 12.5 mg via ORAL
  Filled 2014-09-23 (×2): qty 1

## 2014-09-23 MED ORDER — HYDROCHLOROTHIAZIDE 25 MG PO TABS: 12.5000 mg | ORAL_TABLET | Freq: Every day | ORAL | Status: DC

## 2014-09-23 MED ORDER — LOSARTAN POTASSIUM 50 MG PO TABS
100.0000 mg | ORAL_TABLET | Freq: Every day | ORAL | Status: DC
  Administered 2014-09-23 – 2014-09-24 (×2): 100 mg via ORAL
  Filled 2014-09-23 (×2): qty 2

## 2014-09-23 NOTE — Progress Notes (Signed)
Report given to RN. Patient is stable at transfer.

## 2014-09-23 NOTE — Progress Notes (Signed)
  Echocardiogram 2D Echocardiogram has been performed.  Ruta Capece FRANCES 09/23/2014, 1:18 PM 

## 2014-09-23 NOTE — Progress Notes (Signed)
Utilization review completed.  

## 2014-09-23 NOTE — Progress Notes (Signed)
*  PRELIMINARY RESULTS* Vascular Ultrasound Carotid Duplex has been completed.  Preliminary findings: Bilateral:  1-39% ICA stenosis.  Vertebral artery flow is antegrade.      Landry Mellow, RDMS, RVT  09/23/2014, 9:56 AM

## 2014-09-23 NOTE — Progress Notes (Signed)
Patient Demographics  Timothy Gentry, is a 58 y.o. male, DOB - [birth year], ZOX:[id number]  Admit date - 09/22/2014   Admitting Physician Albertine Patricia, MD  Outpatient Primary MD for the patient is Marylene Land, MD  LOS - 1   Chief Complaint  Patient presents with  . Rectal Bleeding      Admission history of present illness/brief narrative:  Timothy Gentry is a 58 y.o. 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 Patient was admitted for an episode of bright red blood per rectum, where he reports having syncope as well in the bathroom, patient denies any previous history of bright red blood per rectum, but there is history of diverticulosis, and hemoglobin has been stable at 13.3, patient was noticed to have episodes of hypotension in ED with systolic of blood pressure being 70, patient reports he had previous episode of syncope while his having drain pulled out in outpatient clinic in [facility] last July, where he reports he was admitted overnight with negative workup, he was told his vasovagal, denies any chest pain, shortness of breath, fever, chills, coffee-ground emesis.  Subjective:   Timothy Gentry today has, No headache, No chest pain, No abdominal pain - No Nausea, No new weakness tingling or numbness, No Cough - SOB. No recurrence of a bright red blood per rectum overnight.  Assessment & Plan    Active Problems:   Bright red blood per rectum   Syncope   HTN (hypertension)   Hyperlipemia  Bright red blood per rectum/lower GI bleed: -This is most likely secondary to diverticulosis, versus hemorrhoidal bleed, discussed with his GI Dr. 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 - H&H has been stable overnight, no recurrence of bleed. -Discussed the case with patient's GI physician Dr. Earlean Shawl, -We'll continue to hold aspirin -SCDs for DVT prophylaxis. -We'll advance to heart healthy modified diet.  Syncope -This is most likely vasovagal, patient will be admitted to step down giving his significant episodes of hypotension in ED, continue with IV fluids, check 2-D echo,  monitor on telemetry -Denies chest pain and shortness of breath. -Corrected Doppler no significant stenosis. -2-D echo pending. -No events on telemetry -Patient was not orthostatic, so will gradually resume his antihypertensive medication given his elevated blood pressure. - Cardiology will arrange for follow-up at syncope clinic for the patient.  Hypertension -Uncontrolled, resume back on home medication.  Hyperlipidemia -Continue with statin  History of a neuroendocrine neoplasm of pancreas. -Agent is following at [facility] , next appointment on 10/25/14, patient was instructed to keep it.  Code Status: Full  Family Communication: Wife at bedside  Disposition Plan: Downgraded to telemetry   Procedures  Carotid Doppler  Consults   Discussed with gastroenterology and cardiology over the phone.   Medications  Scheduled Meds: . sodium chloride   Intravenous STAT  . allopurinol  150 mg Oral Daily  . allopurinol  300 mg Oral QHS  . losartan  100 mg Oral Daily  . pravastatin  80 mg Oral QPM  . sodium chloride  3 mL Intravenous Q12H   Continuous Infusions:  PRN Meds:.acetaminophen **OR** acetaminophen, hydrALAZINE, ondansetron **OR** ondansetron (ZOFRAN) IV  DVT Prophylaxis  SCDs  Lab Results  Component Value Date   PLT 334 09/23/2014    Antibiotics    Anti-infectives    None          Objective:   Filed  Vitals:   09/23/14 0600 09/23/14 0700 09/23/14 0800 09/23/14 0900  BP: 161/85 166/91  173/107  Pulse: 72 75  80  Temp:   98.3 F (36.8 C)   TempSrc:   Oral   Resp: 16 12  17   Height:      Weight:      SpO2: 99% 100%  99%    Wt Readings from Last 3 Encounters:  09/23/14 100.6 kg (221 lb 12.5 oz)  03/29/14 110.043 kg (242 lb 9.6 oz)     Intake/Output Summary (Last 24 hours) at 09/23/14 1028 Last data filed at 09/23/14 1000  Gross per 24 hour  Intake   2183 ml  Output   2650 ml  Net   -467 ml     Physical Exam  Awake Alert, Oriented X 3, No new F.N deficits, Normal affect [place].AT,PERRAL Supple Neck,No JVD, No cervical lymphadenopathy appriciated.  Symmetrical Chest wall movement, Good air movement bilaterally, CTAB RRR,No Gallops,Rubs or new Murmurs, No Parasternal Heave +ve B.Sounds, Abd Soft, No tenderness, No organomegaly appriciated, No rebound - guarding or rigidity. No Cyanosis, Clubbing or edema, No new Rash or bruise     Data Review   Micro Results Recent Results (from the past 240 hour(s))  MRSA PCR Screening     Status: None   Collection Time: 09/22/14  3:36 PM  Result Value Ref Range Status   MRSA by PCR NEGATIVE NEGATIVE Final    Comment:        The GeneXpert MRSA Assay (FDA approved for NASAL specimens only), is one component of a comprehensive MRSA colonization surveillance program. It is not intended to diagnose MRSA infection nor to guide or monitor treatment for MRSA infections.     Radiology Reports Dg Abd Acute W/chest  09/22/2014   CLINICAL DATA:  58 year old male with bright red blood per rectum  EXAM: ACUTE ABDOMEN SERIES (ABDOMEN 2 VIEW & CHEST 1 VIEW)  COMPARISON:  Pancreatic MRI 03/22/2014; CT abdomen/ pelvis 03/20/2014, prior chest x-ray 06/05/2007  FINDINGS: The lungs are clear and negative for focal airspace consolidation, pulmonary edema or suspicious pulmonary nodule. No pleural effusion or pneumothorax. Cardiac and mediastinal  contours are within normal limits. No acute fracture or lytic or blastic osseous lesions. The visualized upper abdominal bowel gas pattern is unremarkable.  No free air on the upright view. The bowel gas pattern is nonobstructive. Gas is noted throughout the colon. Multiple radio opacities project over the bilateral renal shadows consistent with nephrolithiasis. On the right, there are at least 2 stones including a 6 mm stone in the interpolar collecting system and a 13 mm stone in the lower pole. On the left, there is a 21 mm partial staghorn calculus in the region of the upper pole infundibulum. Smaller stones are noted just inferior to this. Incompletely evaluated posterior lumbar fixation hardware extending from L4 through S1. Normal bony mineralization. Surgical clips in the right upper quadrant suggest prior cholecystectomy. Laparoscopic hernia tacks noted overlying the lower abdomen. Incompletely imaged left hip prosthesis.  IMPRESSION:  1. Negative chest x-ray. 2. No evidence of obstruction or free air. 3. Bilateral nephrolithiasis.   Electronically Signed   By: Jacqulynn Cadet M.D.   On: 09/22/2014 07:29    CBC  Recent Labs Lab 09/22/14 0540 09/22/14 1348 09/22/14 1554 09/22/14 2158 09/23/14 0340 09/23/14 0920  WBC 16.7*  --   --   --  11.4*  --   HGB 13.3 12.0* 11.7* 11.3* 11.8* 13.4  HCT 42.0 37.6* 36.8* 35.7* 37.0* 41.1  PLT 354  --   --   --  334  --   MCV 82.8  --   --   --  82.6  --   MCH 26.2  --   --   --  26.3  --   MCHC 31.7  --   --   --  31.9  --   RDW 17.0*  --   --   --  16.4*  --   LYMPHSABS 2.5  --   --   --   --   --   MONOABS 1.2*  --   --   --   --   --   EOSABS 0.2  --   --   --   --   --   BASOSABS 0.1  --   --   --   --   --     Chemistries   Recent Labs Lab 09/22/14 0540 09/23/14 0340  NA 136* 138  K 5.4* 4.1  CL 101 103  CO2 23 24  GLUCOSE 179* 110*  BUN 26* 16  CREATININE 1.07 0.98  CALCIUM 9.4 9.3  AST 23  --   ALT 14  --   ALKPHOS 75  --    BILITOT 0.3  --    ------------------------------------------------------------------------------------------------------------------ estimated creatinine clearance is 97.6 mL/min (by C-G formula based on Cr of 0.98). ------------------------------------------------------------------------------------------------------------------ No results for input(s): HGBA1C in the last 72 hours. ------------------------------------------------------------------------------------------------------------------ No results for input(s): CHOL, HDL, LDLCALC, TRIG, CHOLHDL, LDLDIRECT in the last 72 hours. ------------------------------------------------------------------------------------------------------------------ No results for input(s): TSH, T4TOTAL, T3FREE, THYROIDAB in the last 72 hours.  Invalid input(s): FREET3 ------------------------------------------------------------------------------------------------------------------ No results for input(s): VITAMINB12, FOLATE, FERRITIN, TIBC, IRON, RETICCTPCT in the last 72 hours.  Coagulation profile No results for input(s): INR, PROTIME in the last 168 hours.  No results for input(s): DDIMER in the last 72 hours.  Cardiac Enzymes  Recent Labs Lab 09/22/14 0540  TROPONINI <0.30   ------------------------------------------------------------------------------------------------------------------ Invalid input(s): POCBNP     Time Spent in minutes   30 minutes.   [facility], Leyana Whidden M.D on 09/23/2014 at 10:28 AM  Between 7am to 7pm - Pager - [phone]  After 7pm go to [email or web address] - password [email or web address]  And look for the night coverage person covering for me after hours  [facility] Office  [phone]   **Disclaimer: This note may have been dictated with voice recognition software. Similar sounding words can inadvertently be transcribed and this note may contain transcription errors which may not have been corrected upon  publication of note.**

## 2014-09-24 ENCOUNTER — Encounter (HOSPITAL_COMMUNITY): Payer: Self-pay

## 2014-09-24 ENCOUNTER — Encounter: Payer: Self-pay | Admitting: Gastroenterology

## 2014-09-24 ENCOUNTER — Other Ambulatory Visit: Payer: Self-pay | Admitting: Gastroenterology

## 2014-09-24 ENCOUNTER — Encounter (HOSPITAL_COMMUNITY)
Admission: EM | Disposition: A | Discharge status: Home / Self Care | Payer: Self-pay | Source: Home / Self Care | Attending: Internal Medicine

## 2014-09-24 HISTORY — PX: ESOPHAGOGASTRODUODENOSCOPY: SHX5428

## 2014-09-24 HISTORY — PX: COLONOSCOPY: SHX5424

## 2014-09-24 LAB — HEMOGLOBIN AND HEMATOCRIT, BLOOD
HEMATOCRIT: 36.2 % — AB (ref 39.0–52.0)
HEMATOCRIT: 38.5 % — AB (ref 39.0–52.0)
HEMOGLOBIN: 12.4 g/dL — AB (ref 13.0–17.0)
Hemoglobin: 11.4 g/dL — ABNORMAL LOW (ref 13.0–17.0)

## 2014-09-24 SURGERY — COLONOSCOPY
Anesthesia: Moderate Sedation

## 2014-09-24 MED ORDER — DIPHENHYDRAMINE HCL 50 MG/ML IJ SOLN
INTRAMUSCULAR | Status: AC
  Filled 2014-09-24: qty 1

## 2014-09-24 MED ORDER — MIDAZOLAM HCL 10 MG/2ML IJ SOLN
INTRAMUSCULAR | Status: DC | PRN
  Administered 2014-09-24 (×4): 2.5 mg via INTRAVENOUS

## 2014-09-24 MED ORDER — PEG 3350-KCL-NABCB-NACL-NASULF 236 G PO SOLR: ORAL | Status: DC

## 2014-09-24 MED ORDER — HYDROCHLOROTHIAZIDE 12.5 MG PO CAPS: 12.5000 mg | ORAL_CAPSULE | Freq: Every day | ORAL | Status: DC

## 2014-09-24 MED ORDER — MIDAZOLAM HCL 10 MG/2ML IJ SOLN
INTRAMUSCULAR | Status: AC
  Filled 2014-09-24: qty 4

## 2014-09-24 MED ORDER — SODIUM CHLORIDE 0.9 % IV SOLN
INTRAVENOUS | Status: DC
  Administered 2014-09-24: 500 mL via INTRAVENOUS

## 2014-09-24 MED ORDER — BUTAMBEN-TETRACAINE-BENZOCAINE 2-2-14 % EX AERO
INHALATION_SPRAY | CUTANEOUS | Status: DC | PRN
  Administered 2014-09-24: 2 via TOPICAL

## 2014-09-24 MED ORDER — FENTANYL CITRATE 0.05 MG/ML IJ SOLN
INTRAMUSCULAR | Status: DC | PRN
  Administered 2014-09-24 (×4): 25 ug via INTRAVENOUS

## 2014-09-24 MED ORDER — FENTANYL CITRATE 0.05 MG/ML IJ SOLN
INTRAMUSCULAR | Status: AC
  Filled 2014-09-24: qty 4

## 2014-09-24 MED ORDER — DOCUSATE SODIUM 100 MG PO CAPS: 100.0000 mg | ORAL_CAPSULE | Freq: Two times a day (BID) | ORAL | Status: DC

## 2014-09-24 MED ORDER — PEG 3350-KCL-NA BICARB-NACL 420 G PO SOLR
4000.0000 mL | ORAL | Status: AC
  Administered 2014-09-24: 4000 mL via ORAL

## 2014-09-24 NOTE — Progress Notes (Unsigned)
Patient ID: Timothy Gentry, male   DOB: [birth year], 58 y.o.   MRN: [id number]  Colonoscopy note  09/24/2014 5PM  Access to EndoPro denied. Indication:  Lower GI hemorrhage Pre-med:  Versed 7.5 mg, Fentanyl 75 mcg IV Total time: 21'; Withdrawal time:  28' Complications:  None Findings:   1.  No acute bleeding   2.  Diverticulosis, left sided, moderate, non-inflamed   3.  Internal hemorrhoids - grade II, non-inflamed. Recommend:   1. EGD   EGD note 09/24/2014  5:39 PM Indication:  R/o UGI bleeding Pre-med:  Versed 2.5 mg, Fentanyl 25 mcg IV Complications:  None Findings:   1. No active bleeding   2. Hiatal hernia - medium, non-inflamed   3. Otherwise normal endoscopy Recommend:   1. Cautiously advance diet   2.  Monitor H/H   3.  D/c tomorrow if stable   4  Outpatient follow up my office after [place], MD   [phone]

## 2014-09-24 NOTE — Discharge Instructions (Signed)
Follow with Primary MD Marylene Land, MD in 5 days   Get CBC, CMP, 2 view Chest X ray checked  by Primary MD next visit.   Please seek medical assistance if you have any dizziness, lightheadedness, chest pain, shortness of breath, or recurrence of your bright red blood per rectum.  Activity: As tolerated with Full fall precautions use walker/cane & assistance as needed   Disposition Home    Diet: Full liquid diet for the next 48 hours, and advance for soft diet, with gastroenterology regarding further diet instruction after that , with feeding assistance and aspiration precautions as needed.  For Heart failure patients - Check your Weight same time everyday, if you gain over 2 pounds, or you develop in leg swelling, experience more shortness of breath or chest pain, call your Primary MD immediately. Follow Cardiac Low Salt Diet and 1.8 lit/day fluid restriction.   On your next visit with your primary care physician please Get Medicines reviewed and adjusted.   Please request your Prim.MD to go over all Hospital Tests and Procedure/Radiological results at the follow up, please get all Hospital records sent to your Prim MD by signing hospital release before you go home.   If you experience worsening of your admission symptoms, develop shortness of breath, life threatening emergency, suicidal or homicidal thoughts you must seek medical attention immediately by calling 911 or calling your MD immediately  if symptoms less severe.  You Must read complete instructions/literature along with all the possible adverse reactions/side effects for all the Medicines you take and that have been prescribed to you. Take any new Medicines after you have completely understood and accpet all the possible adverse reactions/side effects.   Do not drive, operating heavy machinery, perform activities at heights, swimming or participation in water activities or provide baby sitting services if your were admitted  for syncope or siezures until you have seen by Primary MD or a Neurologist and advised to do so again.  Do not drive when taking Pain medications.    Do not take more than prescribed Pain, Sleep and Anxiety Medications  Special Instructions: If you have smoked or chewed Tobacco  in the last 2 yrs please stop smoking, stop any regular Alcohol  and or any Recreational drug use.  Wear Seat belts while driving.   Please note  You were cared for by a hospitalist during your hospital stay. If you have any questions about your discharge medications or the care you received while you were in the hospital after you are discharged, you can call the unit and asked to speak with the hospitalist on call if the hospitalist that took care of you is not available. Once you are discharged, your primary care physician will handle any further medical issues. Please note that NO REFILLS for any discharge medications will be authorized once you are discharged, as it is imperative that you return to your primary care physician (or establish a relationship with a primary care physician if you do not have one) for your aftercare needs so that they can reassess your need for medications and monitor your lab values.

## 2014-09-24 NOTE — Discharge Summary (Signed)
Timothy Gentry, 58 y.o., DOB 05-Mar-1956, MRN [id number]. Admission date: 09/22/2014 Discharge Date 09/24/2014 Primary MD Marylene Land, MD Admitting Physician Albertine Patricia, MD  Admission Diagnosis  Syncope [R55] Rectal bleeding [K62.5]   PCP please follow; CBC/BMP with next visit within 5 days, to follow on hemoglobin.  Discharge Diagnosis   Active Problems:   Bright red blood per rectum   Syncope   HTN (hypertension)   Hyperlipemia      Past Medical History  Diagnosis Date  . Hematuria   . Diabetes mellitus without complication     Type II  . Hyperlipidemia   . Hypertension     Not adequately controlled  . Colon polyps   . Degenerative disc disease, cervical   . Chronic kidney disease     Chronic, Stage III  . Colon ulcer     Possible NSAID induced  . Cancer     skin    Past Surgical History  Procedure Laterality Date  . Revision total hip arthroplasty  2010  . Colon surgery  09/18/2009    colonoscopy  . Knee surgery Right 1983    Open repair of meniscus of knee  . Lumbar laminectomy  2001  . Lumbar fusion  2002      Hospital Course See H&P, Labs, Consult and Test reports for all details in brief, patient was admitted for **  Active Problems:   Bright red blood per rectum   Syncope   HTN (hypertension)   Hyperlipemia  Timothy Gentry is a 58 y.o. 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 Patient reporte he had an episode of bright red blood per rectum earlier today of admission, as well reports having syncope as  in the bathroom, patient denies any previous history of bright red blood per rectum, but there is history of diverticulosis,  hemoglobin stable at 13.3, which remained stable during hospitalization, [place] and hemoglobin is 12.4 at day of discharge,, agent had  colonoscopy before 5 years which he did show ileocecal ulcer secondary to NSAIDs, and left colon diverticulosis, right hemoglobin been stable patient continued to have further episodes of bright red blood per rectum, so he underwent colonoscopy/endoscopy by Dr. Earlean Shawl on 09/24/14, as per Dr. Earlean Shawl there was no evidence of active bleed, finding were significant for hiatal hernia and endoscopy, without any evidence of gastritis or gastric ulcer or source of bleeding, colonoscopy was significant for moderate to severe diverticulosis in the left colon and internal hemorrhoids, but no stigmata of bleed. Patient requests to be discharged tonight given he has family matters to attend in the morning, his mellitus for liquid diet very well, no further episodes of breath or blood per rectum.  patient was noticed to have episodes of hypotension in ED with systolic of blood pressure being 70, patient reports he had previous episode of syncope while his having drain pulled out in outpatient clinic in [facility] last July, where he reports he was admitted overnight with negative workup, he was told his vasovagal, denies any chest pain, shortness of breath, fever, chills, coffee-ground emesis, was admitted to telemetry without significant events, carotid Doppler was done which did show 1-39 percent stenosis bilaterally, 2-D echo showing mild diastolic dysfunction, EF 40/97%, discussed with cardiology, who will schedule an appointment as an outpatient to follow with syncope clinic with electrophysiology.  Consults   Gastroenterology  Procedures -Endoscopy and colonoscopy 12/15, endoscopy showing hiatal hernia without inflammation, colonoscopy showing moderate to severe diverticulosis and left colon without evidence of active bleed, as well showing internal hemorrhoid without evidence of bleeding stigmata. Significant Tests:  See full reports for all details    Dg Abd Acute W/chest  09/22/2014   CLINICAL DATA:   58 year old male with bright red blood per rectum  EXAM: ACUTE ABDOMEN SERIES (ABDOMEN 2 VIEW & CHEST 1 VIEW)  COMPARISON:  Pancreatic MRI 03/22/2014; CT abdomen/ pelvis 03/20/2014, prior chest x-ray 06/05/2007  FINDINGS: The lungs are clear and negative for focal airspace consolidation, pulmonary edema or suspicious pulmonary nodule. No pleural effusion or pneumothorax. Cardiac and mediastinal contours are within normal limits. No acute fracture or lytic or blastic osseous lesions. The visualized upper abdominal bowel gas pattern is unremarkable.  No free air on the upright view. The bowel gas pattern is nonobstructive. Gas is noted throughout the colon. Multiple radio opacities project over the bilateral renal shadows consistent with nephrolithiasis. On the right, there are at least 2 stones including a 6 mm stone in the interpolar collecting system and a 13 mm stone in the lower pole. On the left, there is a 21 mm partial staghorn calculus in the region of the upper pole infundibulum. Smaller stones are noted just inferior to this. Incompletely evaluated posterior lumbar fixation hardware extending from L4 through S1. Normal bony mineralization. Surgical clips in the right upper quadrant suggest prior cholecystectomy. Laparoscopic hernia tacks noted overlying the lower abdomen. Incompletely imaged left hip prosthesis.  IMPRESSION: 1. Negative chest x-ray. 2. No evidence of obstruction or free air. 3. Bilateral nephrolithiasis.   Electronically Signed   By: Jacqulynn Cadet M.D.   On: 09/22/2014 07:29     Today   Subjective:   Timothy Gentry today has no headache,no chest abdominal pain,no new weakness tingling or numbness, feels much better wants to go home tonight.  Objective:   Blood pressure 133/78, pulse 76, temperature 97.5 F (36.4 C), temperature source Oral, resp. rate 18, height 5\' 10"  (1.778 m), weight 100.6 kg (221 lb 12.5 oz), SpO2 99 %.  Intake/Output Summary (Last 24 hours) at  09/24/14 2207 Last data filed at 09/24/14 1800  Gross per 24 hour  Intake    200 ml  Output      1 ml  Net    199 ml    Exam Awake Alert, Oriented *3, No new F.N deficits, Normal affect [place].AT,PERRAL Supple Neck,No JVD, No cervical lymphadenopathy appriciated.  Symmetrical Chest wall movement, Good air movement bilaterally, CTAB RRR,No Gallops,Rubs or new Murmurs, No Parasternal Heave +ve B.Sounds, Abd Soft, Non tender, No organomegaly appriciated, No rebound -guarding or rigidity. No Cyanosis, Clubbing or edema, No new Rash or bruise  Data Review     CBC w Diff:  Lab Results  Component Value Date   WBC 11.4* 09/23/2014   HGB 12.4* 09/24/2014   HCT 38.5* 09/24/2014   PLT 334 09/23/2014   LYMPHOPCT 15 09/22/2014   MONOPCT 7 09/22/2014   EOSPCT 1 09/22/2014   BASOPCT 0 09/22/2014   CMP:  Lab Results  Component Value Date   NA 138 09/23/2014   K 4.1 09/23/2014   CL 103 09/23/2014   CO2 24 09/23/2014   BUN 16 09/23/2014   CREATININE 0.98 09/23/2014   PROT 6.6 09/22/2014   ALBUMIN 3.1* 09/22/2014   BILITOT 0.3 09/22/2014   ALKPHOS 75 09/22/2014   AST 23  09/22/2014   ALT 14 09/22/2014  .  Micro Results Recent Results (from the past 240 hour(s))  MRSA PCR Screening     Status: None   Collection Time: 09/22/14  3:36 PM  Result Value Ref Range Status   MRSA by PCR NEGATIVE NEGATIVE Final    Comment:        The GeneXpert MRSA Assay (FDA approved for NASAL specimens only), is one component of a comprehensive MRSA colonization surveillance program. It is not intended to diagnose MRSA infection nor to guide or monitor treatment for MRSA infections.      Discharge Instructions          Follow-up Information    Follow up with Marylene Land, MD In 5 days.   Specialty:  Family Medicine   Contact information:   [place] [address] [phone]       Follow up with MEDOFF,JEFFREY R, MD. Schedule an appointment as soon as  possible for a visit in 1 week.   Specialty:  Gastroenterology   Contact information:   [place] [address] [phone]       Discharge Medications     Medication List    STOP taking these medications        aspirin EC 81 MG tablet      TAKE these medications        allopurinol 300 MG tablet  Commonly known as:  ZYLOPRIM  Take 150-300 mg by mouth 2 (two) times daily. 150 mg in the morning and 300 mg in the evening.     docusate sodium 100 MG capsule  Commonly known as:  COLACE  Take 1 capsule (100 mg total) by mouth 2 (two) times daily.     hydrochlorothiazide 12.5 MG tablet  Commonly known as:  HYDRODIURIL  Take 12.5 mg by mouth daily.     losartan 100 MG tablet  Commonly known as:  COZAAR  Take 100 mg by mouth daily.     metFORMIN 500 MG 24 hr tablet  Commonly known as:  GLUCOPHAGE-XR  Take 500 mg by mouth daily.     multivitamin tablet  Take 1 tablet by mouth daily.     pravastatin 80 MG tablet  Commonly known as:  PRAVACHOL  Take 80 mg by mouth every evening.         Total Time in preparing paper work, data evaluation and todays exam - 35 minutes  Dalores Weger M.D on 09/24/2014 at 10:07 PM  [place]  [phone]

## 2014-09-25 ENCOUNTER — Encounter (HOSPITAL_COMMUNITY): Payer: Self-pay | Admitting: Gastroenterology

## 2014-10-17 ENCOUNTER — Institutional Professional Consult (permissible substitution): Payer: 59 | Admitting: Internal Medicine

## 2015-09-15 ENCOUNTER — Encounter (HOSPITAL_COMMUNITY): Payer: Self-pay | Admitting: Emergency Medicine

## 2015-09-15 ENCOUNTER — Encounter (HOSPITAL_COMMUNITY)
Admission: EM | Disposition: A | Discharge status: Home / Self Care | Payer: Self-pay | Source: Home / Self Care | Attending: Thoracic Surgery (Cardiothoracic Vascular Surgery)

## 2015-09-15 ENCOUNTER — Ambulatory Visit (HOSPITAL_COMMUNITY): Admit: 2015-09-15 | Payer: Self-pay | Admitting: Cardiology

## 2015-09-15 ENCOUNTER — Emergency Department (HOSPITAL_COMMUNITY): Payer: Managed Care, Other (non HMO)

## 2015-09-15 ENCOUNTER — Inpatient Hospital Stay (HOSPITAL_COMMUNITY)
Admission: EM | Admit: 2015-09-15 | Discharge: 2015-09-21 | DRG: 234 | Disposition: A | Discharge status: Home / Self Care | Payer: Managed Care, Other (non HMO) | Attending: Thoracic Surgery (Cardiothoracic Vascular Surgery) | Admitting: Thoracic Surgery (Cardiothoracic Vascular Surgery)

## 2015-09-15 DIAGNOSIS — J9811 Atelectasis: Secondary | ICD-10-CM | POA: Diagnosis not present

## 2015-09-15 DIAGNOSIS — D62 Acute posthemorrhagic anemia: Secondary | ICD-10-CM | POA: Diagnosis not present

## 2015-09-15 DIAGNOSIS — E1122 Type 2 diabetes mellitus with diabetic chronic kidney disease: Secondary | ICD-10-CM | POA: Diagnosis present

## 2015-09-15 DIAGNOSIS — I1310 Hypertensive heart and chronic kidney disease without heart failure, with stage 1 through stage 4 chronic kidney disease, or unspecified chronic kidney disease: Secondary | ICD-10-CM | POA: Diagnosis present

## 2015-09-15 DIAGNOSIS — I214 Non-ST elevation (NSTEMI) myocardial infarction: Secondary | ICD-10-CM | POA: Diagnosis present

## 2015-09-15 DIAGNOSIS — Z87891 Personal history of nicotine dependence: Secondary | ICD-10-CM | POA: Diagnosis not present

## 2015-09-15 DIAGNOSIS — I25110 Atherosclerotic heart disease of native coronary artery with unstable angina pectoris: Secondary | ICD-10-CM | POA: Diagnosis not present

## 2015-09-15 DIAGNOSIS — Z981 Arthrodesis status: Secondary | ICD-10-CM | POA: Diagnosis not present

## 2015-09-15 DIAGNOSIS — Z6833 Body mass index (BMI) 33.0-33.9, adult: Secondary | ICD-10-CM

## 2015-09-15 DIAGNOSIS — N183 Chronic kidney disease, stage 3 (moderate): Secondary | ICD-10-CM | POA: Diagnosis present

## 2015-09-15 DIAGNOSIS — I2119 ST elevation (STEMI) myocardial infarction involving other coronary artery of inferior wall: Principal | ICD-10-CM | POA: Diagnosis present

## 2015-09-15 DIAGNOSIS — E785 Hyperlipidemia, unspecified: Secondary | ICD-10-CM | POA: Diagnosis present

## 2015-09-15 DIAGNOSIS — Z96649 Presence of unspecified artificial hip joint: Secondary | ICD-10-CM | POA: Diagnosis present

## 2015-09-15 DIAGNOSIS — I2510 Atherosclerotic heart disease of native coronary artery without angina pectoris: Secondary | ICD-10-CM | POA: Diagnosis present

## 2015-09-15 DIAGNOSIS — Z7982 Long term (current) use of aspirin: Secondary | ICD-10-CM | POA: Diagnosis not present

## 2015-09-15 DIAGNOSIS — I10 Essential (primary) hypertension: Secondary | ICD-10-CM | POA: Diagnosis present

## 2015-09-15 DIAGNOSIS — E669 Obesity, unspecified: Secondary | ICD-10-CM | POA: Diagnosis present

## 2015-09-15 DIAGNOSIS — E1169 Type 2 diabetes mellitus with other specified complication: Secondary | ICD-10-CM

## 2015-09-15 DIAGNOSIS — Z951 Presence of aortocoronary bypass graft: Secondary | ICD-10-CM

## 2015-09-15 HISTORY — DX: Atherosclerotic heart disease of native coronary artery without angina pectoris: I25.10

## 2015-09-15 HISTORY — PX: CARDIAC CATHETERIZATION: SHX172

## 2015-09-15 HISTORY — DX: Presence of aortocoronary bypass graft: Z95.1

## 2015-09-15 HISTORY — DX: Non-ST elevation (NSTEMI) myocardial infarction: I21.4

## 2015-09-15 HISTORY — DX: Benign neoplasm of pancreas: D13.6

## 2015-09-15 LAB — BASIC METABOLIC PANEL
Anion gap: 8 (ref 5–15)
BUN: 12 mg/dL (ref 6–20)
CHLORIDE: 103 mmol/L (ref 101–111)
CO2: 27 mmol/L (ref 22–32)
CREATININE: 1.12 mg/dL (ref 0.61–1.24)
Calcium: 9.2 mg/dL (ref 8.9–10.3)
GFR calc Af Amer: >60 mL/min (ref >60)
GFR calc non Af Amer: >60 mL/min (ref >60)
Glucose, Bld: 119 mg/dL — ABNORMAL HIGH (ref 65–99)
POTASSIUM: 3.9 mmol/L (ref 3.5–5.1)
Sodium: 138 mmol/L (ref 135–145)

## 2015-09-15 LAB — I-STAT TROPONIN, ED: Troponin i, poc: 5.65 ng/mL (ref 0.00–0.08)

## 2015-09-15 LAB — TROPONIN I: TROPONIN I: 6.61 ng/mL — AB (ref <0.031)

## 2015-09-15 LAB — CBC WITH DIFFERENTIAL/PLATELET
BASOS PCT: 0 %
Basophils Absolute: 0 10*3/uL (ref 0.0–0.1)
EOS PCT: 0 %
Eosinophils Absolute: 0 10*3/uL (ref 0.0–0.7)
HEMATOCRIT: 42.1 % (ref 39.0–52.0)
Hemoglobin: 13.8 g/dL (ref 13.0–17.0)
Lymphocytes Relative: 22 %
Lymphs Abs: 3.5 10*3/uL (ref 0.7–4.0)
MCH: 28.2 pg (ref 26.0–34.0)
MCHC: 32.8 g/dL (ref 30.0–36.0)
MCV: 85.9 fL (ref 78.0–100.0)
MONO ABS: 1.3 10*3/uL — AB (ref 0.1–1.0)
MONOS PCT: 8 %
NEUTROS PCT: 70 %
Neutro Abs: 11.3 10*3/uL — ABNORMAL HIGH (ref 1.7–7.7)
PLATELETS: 364 10*3/uL (ref 150–400)
RBC: 4.9 MIL/uL (ref 4.22–5.81)
RDW: 15.4 % (ref 11.5–15.5)
WBC: 16.1 10*3/uL — ABNORMAL HIGH (ref 4.0–10.5)

## 2015-09-15 LAB — D-DIMER, QUANTITATIVE (NOT AT ARMC): D DIMER QUANT: 0.39 ug{FEU}/mL (ref 0.00–0.50)

## 2015-09-15 LAB — GLUCOSE, CAPILLARY: GLUCOSE-CAPILLARY: 112 mg/dL — AB (ref 65–99)

## 2015-09-15 LAB — MRSA PCR SCREENING: MRSA by PCR: NEGATIVE

## 2015-09-15 SURGERY — LEFT HEART CATH AND CORONARY ANGIOGRAPHY
Anesthesia: LOCAL

## 2015-09-15 MED ORDER — HEPARIN BOLUS VIA INFUSION: 4000.0000 [IU] | Freq: Once | INTRAVENOUS | Status: DC

## 2015-09-15 MED ORDER — HEPARIN SODIUM (PORCINE) 1000 UNIT/ML IJ SOLN
INTRAMUSCULAR | Status: AC
  Filled 2015-09-15: qty 1

## 2015-09-15 MED ORDER — MIDAZOLAM HCL 2 MG/2ML IJ SOLN
INTRAMUSCULAR | Status: DC | PRN
  Administered 2015-09-15: 1 mg via INTRAVENOUS

## 2015-09-15 MED ORDER — INFLUENZA VAC SPLIT QUAD 0.5 ML IM SUSY
0.5000 mL | PREFILLED_SYRINGE | INTRAMUSCULAR | Status: AC
  Administered 2015-09-16: 0.5 mL via INTRAMUSCULAR
  Filled 2015-09-15: qty 0.5

## 2015-09-15 MED ORDER — MIDAZOLAM HCL 2 MG/2ML IJ SOLN
INTRAMUSCULAR | Status: AC
  Filled 2015-09-15: qty 2

## 2015-09-15 MED ORDER — ALLOPURINOL 300 MG PO TABS
150.0000 mg | ORAL_TABLET | Freq: Every day | ORAL | Status: DC
  Administered 2015-09-16: 150 mg via ORAL
  Filled 2015-09-15: qty 1

## 2015-09-15 MED ORDER — FENTANYL CITRATE (PF) 100 MCG/2ML IJ SOLN
INTRAMUSCULAR | Status: DC | PRN
  Administered 2015-09-15: 25 ug via INTRAVENOUS

## 2015-09-15 MED ORDER — SODIUM CHLORIDE 0.9 % IJ SOLN: 3.0000 mL | Freq: Two times a day (BID) | INTRAMUSCULAR | Status: DC

## 2015-09-15 MED ORDER — ONDANSETRON HCL 4 MG/2ML IJ SOLN: 4.0000 mg | Freq: Four times a day (QID) | INTRAMUSCULAR | Status: DC | PRN

## 2015-09-15 MED ORDER — NITROGLYCERIN 1 MG/10 ML FOR IR/CATH LAB
INTRA_ARTERIAL | Status: AC
  Filled 2015-09-15: qty 10

## 2015-09-15 MED ORDER — ASPIRIN 81 MG PO TABS: 81.0000 mg | ORAL_TABLET | Freq: Every day | ORAL | Status: DC

## 2015-09-15 MED ORDER — ATORVASTATIN CALCIUM 80 MG PO TABS: 80.0000 mg | ORAL_TABLET | Freq: Every day | ORAL | Status: DC

## 2015-09-15 MED ORDER — ONE-DAILY MULTI VITAMINS PO TABS: 1.0000 | ORAL_TABLET | Freq: Every day | ORAL | Status: DC

## 2015-09-15 MED ORDER — METOPROLOL TARTRATE 12.5 MG HALF TABLET
12.5000 mg | ORAL_TABLET | Freq: Two times a day (BID) | ORAL | Status: DC
  Administered 2015-09-15 – 2015-09-16 (×3): 12.5 mg via ORAL
  Filled 2015-09-15 (×3): qty 1

## 2015-09-15 MED ORDER — SODIUM CHLORIDE 0.9 % IV SOLN: 250.0000 mL | INTRAVENOUS | Status: DC | PRN

## 2015-09-15 MED ORDER — VERAPAMIL HCL 2.5 MG/ML IV SOLN
INTRAVENOUS | Status: AC
  Filled 2015-09-15: qty 2

## 2015-09-15 MED ORDER — HEPARIN (PORCINE) IN NACL 100-0.45 UNIT/ML-% IJ SOLN
1800.0000 [IU]/h | INTRAMUSCULAR | Status: DC
  Administered 2015-09-16: 1200 [IU]/h via INTRAVENOUS
  Administered 2015-09-16: 1800 [IU]/h via INTRAVENOUS
  Filled 2015-09-15 (×2): qty 250

## 2015-09-15 MED ORDER — SODIUM CHLORIDE 0.9 % IJ SOLN: 3.0000 mL | INTRAMUSCULAR | Status: DC | PRN

## 2015-09-15 MED ORDER — ASPIRIN 81 MG PO CHEW: 81.0000 mg | CHEWABLE_TABLET | ORAL | Status: DC

## 2015-09-15 MED ORDER — NITROGLYCERIN 0.4 MG SL SUBL: 0.4000 mg | SUBLINGUAL_TABLET | SUBLINGUAL | Status: DC | PRN

## 2015-09-15 MED ORDER — HEPARIN SODIUM (PORCINE) 1000 UNIT/ML IJ SOLN
INTRAMUSCULAR | Status: DC | PRN
  Administered 2015-09-15: 5000 [IU] via INTRAVENOUS

## 2015-09-15 MED ORDER — IOHEXOL 350 MG/ML SOLN
INTRAVENOUS | Status: DC | PRN
  Administered 2015-09-15: 125 mL via INTRAVENOUS

## 2015-09-15 MED ORDER — LIDOCAINE HCL (PF) 1 % IJ SOLN
INTRAMUSCULAR | Status: DC | PRN
  Administered 2015-09-15: 19:00:00

## 2015-09-15 MED ORDER — HEPARIN SODIUM (PORCINE) 5000 UNIT/ML IJ SOLN: 4000.0000 [IU] | Freq: Once | INTRAMUSCULAR | Status: DC

## 2015-09-15 MED ORDER — SODIUM CHLORIDE 0.9 % WEIGHT BASED INFUSION
3.0000 mL/kg/h | INTRAVENOUS | Status: AC
  Administered 2015-09-15: 3 mL/kg/h via INTRAVENOUS

## 2015-09-15 MED ORDER — HYDROCHLOROTHIAZIDE 12.5 MG PO CAPS
12.5000 mg | ORAL_CAPSULE | Freq: Every day | ORAL | Status: DC
  Administered 2015-09-16: 12.5 mg via ORAL
  Filled 2015-09-15 (×2): qty 1

## 2015-09-15 MED ORDER — HEPARIN (PORCINE) IN NACL 100-0.45 UNIT/ML-% IJ SOLN: 1200.0000 [IU]/h | INTRAMUSCULAR | Status: DC

## 2015-09-15 MED ORDER — LIDOCAINE HCL (PF) 1 % IJ SOLN
INTRAMUSCULAR | Status: AC
  Filled 2015-09-15: qty 30

## 2015-09-15 MED ORDER — LIDOCAINE HCL (PF) 1 % IJ SOLN
INTRAMUSCULAR | Status: DC | PRN
  Administered 2015-09-15: 2 mL

## 2015-09-15 MED ORDER — HEPARIN SODIUM (PORCINE) 5000 UNIT/ML IJ SOLN
INTRAMUSCULAR | Status: AC
  Filled 2015-09-15: qty 1

## 2015-09-15 MED ORDER — ATORVASTATIN CALCIUM 80 MG PO TABS
80.0000 mg | ORAL_TABLET | Freq: Every day | ORAL | Status: DC
  Administered 2015-09-16: 80 mg via ORAL
  Filled 2015-09-15: qty 1

## 2015-09-15 MED ORDER — ACETAMINOPHEN 325 MG PO TABS: 650.0000 mg | ORAL_TABLET | ORAL | Status: DC | PRN

## 2015-09-15 MED ORDER — ALLOPURINOL 300 MG PO TABS
300.0000 mg | ORAL_TABLET | Freq: Every day | ORAL | Status: DC
  Administered 2015-09-15 – 2015-09-16 (×2): 300 mg via ORAL
  Filled 2015-09-15 (×2): qty 1

## 2015-09-15 MED ORDER — FENTANYL CITRATE (PF) 100 MCG/2ML IJ SOLN
INTRAMUSCULAR | Status: AC
  Filled 2015-09-15: qty 2

## 2015-09-15 MED ORDER — SODIUM CHLORIDE 0.9 % IV SOLN: INTRAVENOUS | Status: DC

## 2015-09-15 MED ORDER — LOSARTAN POTASSIUM 50 MG PO TABS
100.0000 mg | ORAL_TABLET | Freq: Every day | ORAL | Status: DC
  Administered 2015-09-16: 100 mg via ORAL
  Filled 2015-09-15: qty 2

## 2015-09-15 MED ORDER — HEPARIN (PORCINE) IN NACL 2-0.9 UNIT/ML-% IJ SOLN
INTRAMUSCULAR | Status: AC
  Filled 2015-09-15: qty 1500

## 2015-09-15 MED ORDER — HEPARIN SODIUM (PORCINE) 5000 UNIT/ML IJ SOLN: 5000.0000 [IU] | Freq: Three times a day (TID) | INTRAMUSCULAR | Status: DC

## 2015-09-15 MED ORDER — HYDROCHLOROTHIAZIDE 25 MG PO TABS: 12.5000 mg | ORAL_TABLET | Freq: Every day | ORAL | Status: DC

## 2015-09-15 MED ORDER — ASPIRIN EC 81 MG PO TBEC
81.0000 mg | DELAYED_RELEASE_TABLET | Freq: Every day | ORAL | Status: DC
  Administered 2015-09-16: 81 mg via ORAL
  Filled 2015-09-15: qty 1

## 2015-09-15 MED ORDER — INSULIN ASPART 100 UNIT/ML ~~LOC~~ SOLN: 0.0000 [IU] | Freq: Three times a day (TID) | SUBCUTANEOUS | Status: DC

## 2015-09-15 MED ORDER — LOSARTAN POTASSIUM 50 MG PO TABS: 100.0000 mg | ORAL_TABLET | Freq: Every day | ORAL | Status: DC

## 2015-09-15 MED ORDER — ALLOPURINOL 300 MG PO TABS: 150.0000 mg | ORAL_TABLET | Freq: Two times a day (BID) | ORAL | Status: DC

## 2015-09-15 MED ORDER — ASPIRIN EC 81 MG PO TBEC: 81.0000 mg | DELAYED_RELEASE_TABLET | Freq: Every day | ORAL | Status: DC

## 2015-09-15 MED ORDER — ADULT MULTIVITAMIN W/MINERALS CH
1.0000 | ORAL_TABLET | Freq: Every day | ORAL | Status: DC
  Administered 2015-09-16: 1 via ORAL
  Filled 2015-09-15: qty 1

## 2015-09-15 MED ORDER — HEPARIN (PORCINE) IN NACL 2-0.9 UNIT/ML-% IJ SOLN
INTRAMUSCULAR | Status: DC | PRN
  Administered 2015-09-15: 10 mL via INTRA_ARTERIAL

## 2015-09-15 SURGICAL SUPPLY — 12 items
CATH INFINITI 5 FR JL3.5 (CATHETERS) ×2 IMPLANT
CATH INFINITI 5FR ANG PIGTAIL (CATHETERS) ×2 IMPLANT
CATH INFINITI JR4 5F (CATHETERS) ×2 IMPLANT
DEVICE RAD COMP TR BAND LRG (VASCULAR PRODUCTS) ×2 IMPLANT
GLIDESHEATH SLEND SS 6F .021 (SHEATH) ×2 IMPLANT
KIT ENCORE 26 ADVANTAGE (KITS) ×2 IMPLANT
KIT HEART LEFT (KITS) ×2 IMPLANT
PACK CARDIAC CATHETERIZATION (CUSTOM PROCEDURE TRAY) ×2 IMPLANT
SYR MEDRAD MARK V 150ML (SYRINGE) ×2 IMPLANT
TRANSDUCER W/STOPCOCK (MISCELLANEOUS) ×2 IMPLANT
TUBING CIL FLEX 10 FLL-RA (TUBING) ×2 IMPLANT
WIRE SAFE-T 1.5MM-J .035X260CM (WIRE) ×2 IMPLANT

## 2015-09-15 NOTE — Progress Notes (Signed)
CRITICAL VALUE ALERT  Critical value received:  Troponin 6.61  Date of notification:  09/15/2015  Time of notification:  10:10 PM  Critical value read back:Yes.    MD notified (1st page):  MD Candyce Churn  Time of first page:  2211  Responding MD:  MD Candyce Churn responded at 2211.  Informed MD that patient is asymptomatic, heparin is scheduled to start at 0330, and aspirin was given to patient in emergency department.  MD advised elevated Troponin values are expected & to notify MD of future elevated values if patient is symptomatic.  MD advised if patient develops chest pain to obtain an EKG and notify MD.  Vicie Mutters, RN

## 2015-09-15 NOTE — Progress Notes (Signed)
ANTICOAGULATION CONSULT NOTE - Initial Consult  Pharmacy Consult for Heparin Indication: chest pain/ACS  Allergies  Allergen Reactions  . Celebrex [Celecoxib] Other (See Comments)    Patient passed out   Patient Measurements: Weight: 221 lb 12.5 oz (100.6 kg)  Height 178 cm IBW 73.2 kg Heparin Dosing Weight: 94.2 kg  Vital Signs: Temp: 99.3 F (37.4 C) (12/05 2003) Temp Source: Oral (12/05 2003) BP: 109/71 mmHg (12/05 2003) Pulse Rate: 73 (12/05 2003) Labs:  Recent Labs  09/15/15 1800  HGB 13.8  HCT 42.1  PLT 364  CREATININE 1.12   CrCl cannot be calculated (Unknown ideal weight.).   Medical History: Past Medical History  Diagnosis Date  . Hematuria   . Diabetes mellitus without complication ([place])     Type II  . Hyperlipidemia   . Hypertension     Not adequately controlled  . Colon polyps   . Degenerative disc disease, cervical   . Chronic kidney disease     Chronic, Stage III  . Colon ulcer     Possible NSAID induced  . Cancer [facility])     skin   Assessment: 59 year old male s/p emergent cardiac cath found to have severe 3 vessel disease awaiting CABG to start IV heparin per pharmacy dosing. R-radial sheath removed at 19:38PM. CBC wnl on admission. No overt bleeding noted. Note SCr of 1.12.   Goal of Therapy:  Heparin level 0.3-0.7 units/ml Monitor platelets by anticoagulation protocol: Yes   Plan:  Start heparin at 1200 units/hr (no bolus 2/2 to recent cath procedure) at 03:30 AM.  Heparin level in 6 hours.  Daily heparin level and CBC.   Sloan Leiter, PharmD, BCPS Clinical Pharmacist [phone] 09/15/2015,8:35 PM

## 2015-09-15 NOTE — ED Notes (Signed)
Patient transported to cath lab

## 2015-09-15 NOTE — ED Provider Notes (Signed)
CSN: [id number]     Arrival date & time 09/15/15  1733 History   First MD Initiated Contact with Patient 09/15/15 1738     Chief Complaint  Patient presents with  . Chest Pain     (Consider location/radiation/quality/duration/timing/severity/associated sxs/prior Treatment) HPI  59 year old male presents with a chief complaint of chest pain. Patient states that 2 days ago he was doing work in the yard and when he finished she felt exhausted and like he couldn't move any further. He states this seemed abnormal to him versus the amount of work he had actually done. Patient states that he still feels fatigued and then noticed chest pressure in the middle of his chest yesterday. Patient states that he is short of breath sooner than typical. Today he went to go see his doctor because he remained with chest pain and was sent to the ER due to EKG changes. Has also been having jaw pain. He was given aspirin and one nitroglycerin by EMS and now feels complete relief. Has a history of hypertension, hyperlipidemia, and diabetes but denies any known CAD.  Past Medical History  Diagnosis Date  . Hematuria   . Diabetes mellitus without complication ([place])     Type II  . Hyperlipidemia   . Hypertension     Not adequately controlled  . Colon polyps   . Degenerative disc disease, cervical   . Chronic kidney disease     Chronic, Stage III  . Colon ulcer     Possible NSAID induced  . Cancer [facility])     skin   Past Surgical History  Procedure Laterality Date  . Revision total hip arthroplasty  2010  . Colon surgery  09/18/2009    colonoscopy  . Knee surgery Right 1983    Open repair of meniscus of knee  . Lumbar laminectomy  2001  . Lumbar fusion  2002  . Colonoscopy N/A 09/24/2014    Procedure: COLONOSCOPY;  Surgeon: Mayme Genta, MD;  Location: [facility] ENDOSCOPY;  Service: Endoscopy;  Laterality: N/A;  . Esophagogastroduodenoscopy N/A 09/24/2014    Procedure: ESOPHAGOGASTRODUODENOSCOPY (EGD);   Surgeon: Mayme Genta, MD;  Location: [facility];  Service: Endoscopy;  Laterality: N/A;   Family History  Problem Relation Age of Onset  . Cancer Father     myoloma   Social History  Substance Use Topics  . Smoking status: Former Smoker    Types: Cigarettes    Quit date: 03/30/1979  . Smokeless tobacco: None  . Alcohol Use: Yes    Review of Systems  Constitutional: Positive for fatigue. Negative for diaphoresis.  Respiratory: Positive for shortness of breath.   Cardiovascular: Positive for chest pain. Negative for leg swelling.  Gastrointestinal: Negative for vomiting.  All other systems reviewed and are negative.     Allergies  Celebrex  Home Medications   Prior to Admission medications   Medication Sig Start Date End Date Taking? Authorizing Provider  allopurinol (ZYLOPRIM) 300 MG tablet Take 150-300 mg by mouth 2 (two) times daily. 150 mg in the morning and 300 mg in the evening.    Historical Provider, MD  docusate sodium (COLACE) 100 MG capsule Take 1 capsule (100 mg total) by mouth 2 (two) times daily. 09/24/14   Silver Huguenin Elgergawy, MD  hydrochlorothiazide (HYDRODIURIL) 12.5 MG tablet Take 12.5 mg by mouth daily.    Historical Provider, MD  losartan (COZAAR) 100 MG tablet Take 100 mg by mouth daily.    Historical Provider, MD  metFORMIN (  GLUCOPHAGE-XR) 500 MG 24 hr tablet Take 500 mg by mouth daily.  03/15/14   Historical Provider, MD  Multiple Vitamin (MULTIVITAMIN) tablet Take 1 tablet by mouth daily.    Historical Provider, MD  pravastatin (PRAVACHOL) 80 MG tablet Take 80 mg by mouth every evening.  03/15/14   Historical Provider, MD   BP 109/68 mmHg  Pulse 78  Temp(Src) 98.5 F (36.9 C) (Oral)  Resp 17  SpO2 97% Physical Exam  Constitutional: He is oriented to person, place, and time. He appears well-developed and well-nourished.  HENT:  Head: Normocephalic and atraumatic.  Right Ear: External ear normal.  Left Ear: External ear normal.  Nose: Nose  normal.  Eyes: Right eye exhibits no discharge. Left eye exhibits no discharge.  Neck: Neck supple.  Cardiovascular: Normal rate, regular rhythm, normal heart sounds and intact distal pulses.   Pulmonary/Chest: Effort normal and breath sounds normal. He exhibits no tenderness.  Abdominal: Soft. He exhibits no distension. There is no tenderness.  Musculoskeletal: He exhibits no edema.  Neurological: He is alert and oriented to person, place, and time.  Skin: Skin is warm and dry.  Nursing note and vitals reviewed.   ED Course  Procedures (including critical care time) Labs Review Labs Reviewed  CBC WITH DIFFERENTIAL/PLATELET - Abnormal; Notable for the following:    WBC 16.1 (*)    All other components within normal limits  BASIC METABOLIC PANEL  D-DIMER, QUANTITATIVE (NOT AT [facility])  I-STAT TROPOININ, ED    Imaging Review No results found. I have personally reviewed and evaluated these images and lab results as part of my medical decision-making.  ED ECG REPORT   Date: 09/15/2015  Rate: 80  Rhythm: normal sinus rhythm  QRS Axis: normal  Intervals: normal  ST/T Wave abnormalities: ST elevations inferiorly  Conduction Disutrbances:right bundle branch block  Narrative Interpretation:   Old EKG Reviewed: changes noted  I have personally reviewed the EKG tracing and agree with the computerized printout as noted.     EKG Interpretation  Date/Time:  Monday September 15 2015 17:59:01 EST Ventricular Rate:  69 PR Interval:  191 QRS Duration: 144 QT Interval:  417 QTC Calculation: 447 R Axis:   114 Text Interpretation:  Sinus rhythm RBBB and LPFB ST changes improved from earlier in day Confirmed by Jojo Geving  MD, Ardra Kuznicki ([id number]) on 09/15/2015 6:13:28 PM       CRITICAL CARE Performed by: Sherwood Gambler T   Total critical care time: 30 minutes  Critical care time was exclusive of separately billable procedures and treating other patients.  Critical care was necessary to  treat or prevent imminent or life-threatening deterioration.  Critical care was time spent personally by me on the following activities: development of treatment plan with patient and/or surrogate as well as nursing, discussions with consultants, evaluation of patient's response to treatment, examination of patient, obtaining history from patient or surrogate, ordering and performing treatments and interventions, ordering and review of laboratory studies, ordering and review of radiographic studies, pulse oximetry and re-evaluation of patient's condition.  MDM   Final diagnoses:  NSTEMI (non-ST elevated myocardial infarction) ([place])    6:12 PM Due to ST changes cardiology has been consulted. Dr. Burt Knack has evaluated EKGs and feels the first has some subtle ST elevations that could be injury. Improved on repeat EKG. Will not call code STEMI given improvement of symptoms and since he's pain free but cards will eval.  6:28 PM Dr. Burt Knack has evaluated and feels this is  ACS and could have been subtle STEMI and will activate the cath lab. Patient will be given 4000 units heparin.  Sherwood Gambler, MD 09/15/15 [phone]

## 2015-09-15 NOTE — ED Notes (Signed)
Per [facility] patient from family medicine due to EKG changes (reported new RBBB).  Patient complains of constant chest pressure since Saturday.  En route patient received 324 mg aspirin and 1x nitro. 18g IV in right AC on arrival.  Patient in no apparent distress at this time.

## 2015-09-15 NOTE — H&P (Signed)
History and Physical  Patient ID: Timothy Gentry MRN: [id number], SOB: 12/17/1955 59 y.o. Date of Encounter: 09/15/2015, 6:30 PM  Primary Physician: Marylene Land, MD Primary Cardiologist: None  Chief Complaint: Chest and jaw pain  HPI: 59 y.o. male w/ PMHx significant for HTN, diabetes, hyperlipidemia who presented to [facility] on 09/15/2015 with complaints of jaw pain.  The patient complains of 48 hours of jaw pain. There has also been chest tightness intermittently over the same time period. He complains of marked fatigue and shortness of breath with activity. All of these symptoms began abruptly. The patient did a lot of hard work on Saturday prior to the onset of his symptoms. He has been followed regularly for hypertension, hyperlipidemia, and type 2 diabetes. He states that all of these chronic problems of been well controlled on medical therapy.  After arrival in the emergency department, the patient was given sublingual nitroglycerin with improvement in his jaw pain. He now describes his pain as "mild." His breathing is improved and he has no further chest pain at this time.   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 He has no upcoming surgeries in the next 6-12 months. He denies any bleeding problems.   Past Medical History  Diagnosis Date  . Hematuria   . Diabetes mellitus without complication ([place])     Type II  . Hyperlipidemia   . Hypertension     Not adequately controlled  . Colon polyps   . Degenerative disc disease, cervical   . Chronic kidney disease     Chronic, Stage III  . Colon ulcer     Possible NSAID induced  . Cancer [facility])     skin     Surgical History:  Past Surgical History  Procedure Laterality Date  . Revision total hip arthroplasty  2010  . Colon surgery  09/18/2009    colonoscopy  . Knee surgery Right 1983    Open repair of meniscus of knee  . Lumbar laminectomy  2001  . Lumbar fusion  2002  .  Colonoscopy N/A 09/24/2014    Procedure: COLONOSCOPY;  Surgeon: Mayme Genta, MD;  Location: [facility];  Service: Endoscopy;  Laterality: N/A;  . Esophagogastroduodenoscopy N/A 09/24/2014    Procedure: ESOPHAGOGASTRODUODENOSCOPY (EGD);  Surgeon: Mayme Genta, MD;  Location: [facility];  Service: Endoscopy;  Laterality: N/A;     Home Meds: Prior to Admission medications   Medication Sig Start Date End Date Taking? Authorizing Provider  allopurinol (ZYLOPRIM) 300 MG tablet Take 150-300 mg by mouth 2 (two) times daily. 150 mg in the morning and 300 mg in the evening.   Yes Historical Provider, MD  aspirin 81 MG tablet Take 81 mg by mouth daily.   Yes Historical Provider, MD  hydrochlorothiazide (HYDRODIURIL) 12.5 MG tablet Take 12.5 mg by mouth daily.   Yes Historical Provider, MD  losartan (COZAAR) 100 MG tablet Take 100 mg by mouth daily.   Yes Historical Provider, MD  metFORMIN (GLUCOPHAGE-XR) 500 MG 24 hr tablet Take 500 mg by mouth 2 (two) times daily.  03/15/14  Yes Historical Provider, MD  Multiple Vitamin (MULTIVITAMIN) tablet Take 1 tablet by mouth daily.   Yes Historical Provider, MD  pravastatin (PRAVACHOL) 80 MG tablet Take 80 mg by mouth every evening.  03/15/14  Yes Historical Provider, MD    Allergies:  Allergies  Allergen Reactions  . Celebrex [Celecoxib] Other (See Comments)    Patient passed  out    Social History   Social History  . Marital Status: Married    Spouse Name: N/A  . Number of Children: N/A  . Years of Education: N/A   Occupational History  . Not on file.   Social History Main Topics  . Smoking status: Former Smoker    Types: Cigarettes    Quit date: 03/30/1979  . Smokeless tobacco: Not on file  . Alcohol Use: Yes  . Drug Use: No  . Sexual Activity: Not on file   Other Topics Concern  . Not on file   Social History Narrative     Family History  Problem Relation Age of Onset  . Cancer Father     myoloma    Review of  Systems: General: negative for chills, fever, night sweats or weight changes.  ENT: negative for rhinorrhea or epistaxis Cardiovascular: See history of present illness Dermatological: negative for rash Respiratory: negative for cough or wheezing, positive for shortness of breath GI: negative for nausea, vomiting, diarrhea, bright red blood per rectum, melena, or hematemesis GU: no hematuria, urgency, or frequency Neurologic: negative for visual changes, syncope, headache, or dizziness Heme: no easy bruising or bleeding Endo: negative for excessive thirst, thyroid disorder, or flushing Musculoskeletal: negative for joint pain or swelling, negative for myalgias All other systems reviewed and are otherwise negative except as noted above.  Physical Exam: Blood pressure 109/68, pulse 78, temperature 98.5 F (36.9 C), temperature source Oral, resp. rate 17, SpO2 97 %. General: Well developed, well nourished, alert and oriented, in no acute distress. HEENT: Normocephalic, atraumatic, sclera anicteric Neck: Supple. Carotids 2+ without bruits. JVP normal Lungs: Clear bilaterally to auscultation without wheezes, rales, or rhonchi. Breathing is unlabored. Heart: RRR with normal S1 and S2. No murmurs, rubs, or gallops appreciated. Abdomen: Soft, non-tender, non-distended with normoactive bowel sounds. No hepatomegaly. No rebound/guarding. No obvious abdominal masses. Back: No CVA tenderness Msk:  Strength and tone appear normal for age. Extremities: No clubbing, cyanosis, or edema.  Distal pedal pulses are 2+ and equal bilaterally. Neuro: CNII-XII intact, moves all extremities spontaneously. Psych:  Responds to questions appropriately with a normal affect. Skin: warm and dry without rash   Labs:   Lab Results  Component Value Date   WBC 16.1* 09/15/2015   HGB 13.8 09/15/2015   HCT 42.1 09/15/2015   MCV 85.9 09/15/2015   PLT 364 09/15/2015   No results for input(s): NA, K, CL, CO2, BUN,  CREATININE, CALCIUM, PROT, BILITOT, ALKPHOS, ALT, AST, GLUCOSE in the last 168 hours.  Invalid input(s): LABALBU No results for input(s): CKTOTAL, CKMB, TROPONINI in the last 72 hours. No results found for: CHOL, HDL, LDLCALC, TRIG No results found for: DDIMER  Radiology/Studies:  No results found.   EKG: Normal sinus rhythm, right bundle branch block, ST and T wave changes suggestive of inferior injury with ST elevation in a single lead (III) and subtle < 1 mm ST elevation in aVF  ASSESSMENT AND PLAN:  1. NSTEMI with ongoing ischemic symptoms: While EKG is not diagnostic of STEMI, there are dynamic changes with improvement in ST elevation after administration of NTG. Emergency cardiac cath and PCI are indicated. I have reviewed the risks, indications, and alternatives to cardiac catheterization, possible angioplasty, and stenting with the patient and his wife. Risks include but are not limited to bleeding, infection, vascular injury, stroke, myocardial infection, arrhythmia, kidney injury, radiation-related injury in the case of prolonged fluoroscopy use, emergency cardiac surgery, and death. The patient  understands the risks of serious complication is 1-2 in [id number] with diagnostic cardiac cath and 1-2% or less with angioplasty/stenting. D/W Dr Martinique who is on call for the cath lab. Pt will be given heparin 4000 units IV x 1 now. Further plans pending cath result.   2. HTN: continue antihypertensive Rx, pt on ARB. Add beta-blocker as tolerated.   3. Hyperlipidemia: initiate high-intensity statin drug. Pt has been on pravastatin 80 mg long-term.   4. Type 2 DM: hold metformin for cath. SSI while hospitalized.   Deatra James MD 09/15/2015, 6:30 PM

## 2015-09-15 NOTE — Interval H&P Note (Signed)
History and Physical Interval Note:  09/15/2015 6:56 PM  Timothy Gentry  has presented today for surgery, with the diagnosis of non Stemi  The various methods of treatment have been discussed with the patient and family. After consideration of risks, benefits and other options for treatment, the patient has consented to  Procedure(s): Left Heart Cath and Coronary Angiography (N/A) as a surgical intervention .  The patient's history has been reviewed, patient examined, no change in status, stable for surgery.  I have reviewed the patient's chart and labs.  Questions were answered to the patient's satisfaction.    Cath Lab Visit (complete for each Cath Lab visit)  Clinical Evaluation Leading to the Procedure:   ACS: Yes.    Non-ACS:    Anginal Classification: CCS IV  Anti-ischemic medical therapy: No Therapy  Non-Invasive Test Results: No non-invasive testing performed  Prior CABG: No previous CABG       [name] 09/15/2015 6:56 PM

## 2015-09-16 ENCOUNTER — Other Ambulatory Visit (HOSPITAL_COMMUNITY): Payer: Managed Care, Other (non HMO)

## 2015-09-16 ENCOUNTER — Inpatient Hospital Stay (HOSPITAL_COMMUNITY): Payer: Managed Care, Other (non HMO)

## 2015-09-16 ENCOUNTER — Encounter (HOSPITAL_COMMUNITY): Payer: Self-pay | Admitting: Cardiology

## 2015-09-16 ENCOUNTER — Other Ambulatory Visit: Payer: Self-pay | Admitting: *Deleted

## 2015-09-16 DIAGNOSIS — I2510 Atherosclerotic heart disease of native coronary artery without angina pectoris: Secondary | ICD-10-CM

## 2015-09-16 DIAGNOSIS — I25110 Atherosclerotic heart disease of native coronary artery with unstable angina pectoris: Secondary | ICD-10-CM

## 2015-09-16 LAB — COMPREHENSIVE METABOLIC PANEL
ALT: 27 U/L (ref 17–63)
AST: 58 U/L — AB (ref 15–41)
Albumin: 3.3 g/dL — ABNORMAL LOW (ref 3.5–5.0)
Alkaline Phosphatase: 78 U/L (ref 38–126)
Anion gap: 10 (ref 5–15)
BUN: 10 mg/dL (ref 6–20)
CHLORIDE: 102 mmol/L (ref 101–111)
CO2: 27 mmol/L (ref 22–32)
Calcium: 9.6 mg/dL (ref 8.9–10.3)
Creatinine, Ser: 1.2 mg/dL (ref 0.61–1.24)
Glucose, Bld: 113 mg/dL — ABNORMAL HIGH (ref 65–99)
POTASSIUM: 4.2 mmol/L (ref 3.5–5.1)
SODIUM: 139 mmol/L (ref 135–145)
TOTAL PROTEIN: 7 g/dL (ref 6.5–8.1)
Total Bilirubin: 2.1 mg/dL — ABNORMAL HIGH (ref 0.3–1.2)

## 2015-09-16 LAB — BLOOD GAS, ARTERIAL
ACID-BASE DEFICIT: 0.7 mmol/L (ref 0.0–2.0)
Bicarbonate: 22.4 mEq/L (ref 20.0–24.0)
Drawn by: 406621
FIO2: 0.21
O2 SAT: 95.4 %
PO2 ART: 71.1 mmHg — AB (ref 80.0–100.0)
Patient temperature: 98.6
TCO2: 23.3 mmol/L (ref 0–100)
pCO2 arterial: 30.5 mmHg — ABNORMAL LOW (ref 35.0–45.0)
pH, Arterial: 7.479 — ABNORMAL HIGH (ref 7.350–7.450)

## 2015-09-16 LAB — SPIROMETRY WITH GRAPH
FEF 25-75 Post: 6.28 L/sec
FEF 25-75 Pre: 3.99 L/sec
FEF2575-%Change-Post: 57 %
FEF2575-%Pred-Post: 210 %
FEF2575-%Pred-Pre: 133 %
FEV1-%CHANGE-POST: 19 %
FEV1-%PRED-PRE: 76 %
FEV1-%Pred-Post: 90 %
FEV1-POST: 3.28 L
FEV1-PRE: 2.75 L
FEV1FVC-%Change-Post: 4 %
FEV1FVC-%Pred-Pre: 113 %
FEV6-%Change-Post: 14 %
FEV6-%PRED-PRE: 70 %
FEV6-%Pred-Post: 80 %
FEV6-POST: 3.67 L
FEV6-Pre: 3.2 L
FEV6FVC-%PRED-POST: 105 %
FEV6FVC-%Pred-Pre: 105 %
FVC-%CHANGE-POST: 14 %
FVC-%PRED-POST: 76 %
FVC-%PRED-PRE: 66 %
FVC-PRE: 3.2 L
FVC-Post: 3.67 L
POST FEV6/FVC RATIO: 100 %
PRE FEV6/FVC RATIO: 100 %
Post FEV1/FVC ratio: 90 %
Pre FEV1/FVC ratio: 86 %

## 2015-09-16 LAB — BASIC METABOLIC PANEL
Anion gap: 6 (ref 5–15)
BUN: 10 mg/dL (ref 6–20)
CALCIUM: 8.8 mg/dL — AB (ref 8.9–10.3)
CO2: 26 mmol/L (ref 22–32)
Chloride: 105 mmol/L (ref 101–111)
Creatinine, Ser: 1.12 mg/dL (ref 0.61–1.24)
GFR calc Af Amer: >60 mL/min (ref >60)
GFR calc non Af Amer: >60 mL/min (ref >60)
GLUCOSE: 140 mg/dL — AB (ref 65–99)
Potassium: 3.8 mmol/L (ref 3.5–5.1)
SODIUM: 137 mmol/L (ref 135–145)

## 2015-09-16 LAB — PROTIME-INR
INR: 1.1 (ref 0.00–1.49)
Prothrombin Time: 14.4 seconds (ref 11.6–15.2)

## 2015-09-16 LAB — URINALYSIS, ROUTINE W REFLEX MICROSCOPIC
Glucose, UA: NEGATIVE mg/dL
HGB URINE DIPSTICK: NEGATIVE
Ketones, ur: 15 mg/dL — AB
LEUKOCYTES UA: NEGATIVE
Nitrite: NEGATIVE
PROTEIN: NEGATIVE mg/dL
Specific Gravity, Urine: 1.029 (ref 1.005–1.030)
pH: 5.5 (ref 5.0–8.0)

## 2015-09-16 LAB — TROPONIN I
TROPONIN I: 6.57 ng/mL — AB (ref <0.031)
Troponin I: 7.22 ng/mL (ref <0.031)

## 2015-09-16 LAB — CBC
HEMATOCRIT: 42.4 % (ref 39.0–52.0)
HEMOGLOBIN: 14 g/dL (ref 13.0–17.0)
MCH: 28.5 pg (ref 26.0–34.0)
MCHC: 33 g/dL (ref 30.0–36.0)
MCV: 86.2 fL (ref 78.0–100.0)
Platelets: 355 10*3/uL (ref 150–400)
RBC: 4.92 MIL/uL (ref 4.22–5.81)
RDW: 15.4 % (ref 11.5–15.5)
WBC: 15.6 10*3/uL — ABNORMAL HIGH (ref 4.0–10.5)

## 2015-09-16 LAB — ABO/RH: ABO/RH(D): A POS

## 2015-09-16 LAB — LIPID PANEL
CHOLESTEROL: 141 mg/dL (ref 0–200)
HDL: 34 mg/dL — ABNORMAL LOW (ref >40)
LDL Cholesterol: 84 mg/dL (ref 0–99)
Total CHOL/HDL Ratio: 4.1 RATIO
Triglycerides: 117 mg/dL (ref <150)
VLDL: 23 mg/dL (ref 0–40)

## 2015-09-16 LAB — SURGICAL PCR SCREEN
MRSA, PCR: NEGATIVE
STAPHYLOCOCCUS AUREUS: POSITIVE — AB

## 2015-09-16 LAB — GLUCOSE, CAPILLARY
Glucose-Capillary: 111 mg/dL — ABNORMAL HIGH (ref 65–99)
Glucose-Capillary: 89 mg/dL (ref 65–99)
Glucose-Capillary: 120 mg/dL — ABNORMAL HIGH (ref 65–99)
Glucose-Capillary: 144 mg/dL — ABNORMAL HIGH (ref 65–99)

## 2015-09-16 LAB — TYPE AND SCREEN
ABO/RH(D): A POS
ANTIBODY SCREEN: NEGATIVE

## 2015-09-16 LAB — HEPARIN LEVEL (UNFRACTIONATED)
HEPARIN UNFRACTIONATED: 0.1 [IU]/mL — AB (ref 0.30–0.70)
HEPARIN UNFRACTIONATED: 0.16 [IU]/mL — AB (ref 0.30–0.70)

## 2015-09-16 LAB — APTT: APTT: 49 s — AB (ref 24–37)

## 2015-09-16 MED ORDER — MAGNESIUM SULFATE 50 % IJ SOLN
40.0000 meq | INTRAMUSCULAR | Status: DC
  Filled 2015-09-16 (×2): qty 10

## 2015-09-16 MED ORDER — HEPARIN BOLUS VIA INFUSION
2800.0000 [IU] | Freq: Once | INTRAVENOUS | Status: AC
  Administered 2015-09-16: 2800 [IU] via INTRAVENOUS
  Filled 2015-09-16: qty 2800

## 2015-09-16 MED ORDER — DEXTROSE 5 % IV SOLN
750.0000 mg | INTRAVENOUS | Status: DC
  Filled 2015-09-16: qty 750

## 2015-09-16 MED ORDER — TEMAZEPAM 15 MG PO CAPS
15.0000 mg | ORAL_CAPSULE | Freq: Once | ORAL | Status: AC | PRN
  Administered 2015-09-16: 15 mg via ORAL
  Filled 2015-09-16: qty 1

## 2015-09-16 MED ORDER — VANCOMYCIN HCL 1000 MG IV SOLR
INTRAVENOUS | Status: AC
  Administered 2015-09-17: 1000 mL
  Filled 2015-09-16: qty 1000

## 2015-09-16 MED ORDER — CHLORHEXIDINE GLUCONATE 0.12 % MT SOLN
15.0000 mL | Freq: Once | OROMUCOSAL | Status: AC
  Administered 2015-09-17: 15 mL via OROMUCOSAL
  Filled 2015-09-16: qty 15

## 2015-09-16 MED ORDER — VANCOMYCIN HCL 10 G IV SOLR
1500.0000 mg | INTRAVENOUS | Status: AC
  Administered 2015-09-17: 1500 mg via INTRAVENOUS
  Filled 2015-09-16: qty 1500

## 2015-09-16 MED ORDER — CHLORHEXIDINE GLUCONATE 4 % EX LIQD
60.0000 mL | Freq: Once | CUTANEOUS | Status: AC
  Administered 2015-09-16: 4 via TOPICAL
  Filled 2015-09-16: qty 60

## 2015-09-16 MED ORDER — METOPROLOL TARTRATE 12.5 MG HALF TABLET
12.5000 mg | ORAL_TABLET | Freq: Once | ORAL | Status: AC
  Administered 2015-09-17: 12.5 mg via ORAL
  Filled 2015-09-16: qty 1

## 2015-09-16 MED ORDER — PHENYLEPHRINE HCL 10 MG/ML IJ SOLN
30.0000 ug/min | INTRAVENOUS | Status: DC
  Filled 2015-09-16: qty 2

## 2015-09-16 MED ORDER — ALBUTEROL SULFATE (2.5 MG/3ML) 0.083% IN NEBU
2.5000 mg | INHALATION_SOLUTION | Freq: Once | RESPIRATORY_TRACT | Status: AC
  Administered 2015-09-16: 2.5 mg via RESPIRATORY_TRACT

## 2015-09-16 MED ORDER — NITROGLYCERIN IN D5W 200-5 MCG/ML-% IV SOLN
2.0000 ug/min | INTRAVENOUS | Status: AC
  Administered 2015-09-17: 5 ug/min via INTRAVENOUS
  Filled 2015-09-16: qty 250

## 2015-09-16 MED ORDER — DOPAMINE-DEXTROSE 3.2-5 MG/ML-% IV SOLN
0.0000 ug/kg/min | INTRAVENOUS | Status: DC
Start: 2015-09-17 — End: 2015-09-17
  Filled 2015-09-16: qty 250

## 2015-09-16 MED ORDER — BISACODYL 5 MG PO TBEC
5.0000 mg | DELAYED_RELEASE_TABLET | Freq: Once | ORAL | Status: AC
Start: 2015-09-16 — End: 2015-09-16
  Administered 2015-09-16: 5 mg via ORAL
  Filled 2015-09-16: qty 1

## 2015-09-16 MED ORDER — CHLORHEXIDINE GLUCONATE 4 % EX LIQD
60.0000 mL | Freq: Once | CUTANEOUS | Status: AC
  Administered 2015-09-17: 4 via TOPICAL
  Filled 2015-09-16: qty 60

## 2015-09-16 MED ORDER — AMINOCAPROIC ACID 250 MG/ML IV SOLN
INTRAVENOUS | Status: DC
  Filled 2015-09-16: qty 40

## 2015-09-16 MED ORDER — INSULIN REGULAR HUMAN 100 UNIT/ML IJ SOLN
INTRAMUSCULAR | Status: DC
  Filled 2015-09-16: qty 2.5

## 2015-09-16 MED ORDER — PLASMA-LYTE 148 IV SOLN
INTRAVENOUS | Status: AC
  Administered 2015-09-17: 500 mL
  Filled 2015-09-16: qty 2.5

## 2015-09-16 MED ORDER — DEXMEDETOMIDINE HCL IN NACL 400 MCG/100ML IV SOLN
0.1000 ug/kg/h | INTRAVENOUS | Status: DC
  Filled 2015-09-16: qty 100

## 2015-09-16 MED ORDER — DEXTROSE 5 % IV SOLN
1.5000 g | INTRAVENOUS | Status: AC
  Administered 2015-09-17: .75 g via INTRAVENOUS
  Administered 2015-09-17: 1.5 g via INTRAVENOUS
  Filled 2015-09-16: qty 1.5

## 2015-09-16 MED ORDER — POTASSIUM CHLORIDE 2 MEQ/ML IV SOLN
80.0000 meq | INTRAVENOUS | Status: DC
  Filled 2015-09-16: qty 40

## 2015-09-16 MED ORDER — SODIUM CHLORIDE 0.9 % IV SOLN
INTRAVENOUS | Status: DC
  Filled 2015-09-16: qty 30

## 2015-09-16 MED ORDER — EPINEPHRINE HCL 1 MG/ML IJ SOLN
0.0000 ug/min | INTRAMUSCULAR | Status: DC
  Filled 2015-09-16: qty 4

## 2015-09-16 NOTE — Progress Notes (Signed)
  Echocardiogram 2D Echocardiogram has been performed.  Darlina Sicilian M 09/16/2015, 4:33 PM

## 2015-09-16 NOTE — Care Management Note (Signed)
Case Management Note  Patient Details  Name: Timothy Gentry MRN: [id number] Date of Birth: [birth year]  Subjective/Objective:      Adm w nstemi            Action/Plan: lives w wife, pcp dr Collier Salina blomgren   Expected Discharge Date:                  Expected Discharge Plan:     In-House Referral:     Discharge planning Services     Post Acute Care Choice:    Choice offered to:     DME Arranged:    DME Agency:     HH Arranged:    [place] Agency:     Status of Service:     Medicare Important Message Given:    Date Medicare IM Given:    Medicare IM give by:    Date Additional Medicare IM Given:    Additional Medicare Important Message give by:     If discussed at [place] of Stay Meetings, dates discussed:    Additional Comments: ur review done  Lacretia Leigh, RN 09/16/2015, 7:33 AM

## 2015-09-16 NOTE — Progress Notes (Signed)
Pre-op Cardiac Surgery  Carotid Findings:     Findings suggest 1-39% internal carotid artery stenosis bilaterally. Vertebral arteries are patent with antegrade flow.  09/16/2015 15:29  Michelle Simonetti, RVT, RDCS, RDMS   Limb dopplers pending. Upper Extremity Right Left  Brachial Pressures 97 Triphasic 100 Truohasic  Radial Waveforms Triphasic Triphasic  Ulnar Waveforms Triphasic Triphasic  Palmar Arch (Allen's Test) Normal Normal   Findings:  Doppler waveforms remained normal bilaterally with both radial and ulnar compressions    Lower  Extremity Right Left  Anterior Tibial 100 Triphasic 109 Triphasic  Posterior Tibial 139 Triphasic 137 Triphasic  Ankle/Brachial Indices 1.39 1.37    Findings:  ABIs and Doppler waveforms are within normal limits bilaterally at rest.  09/16/2015 18:15 [facility], [place]

## 2015-09-16 NOTE — Consult Note (Addendum)
[address]       [place] [address]             [phone]        Timothy Gentry  Medical Record [id number] Date of Birth: 1955/10/30  Referring: Dr. Burt Knack Primary Care: Marylene Land, MD  Chief Complaint:    Chief Complaint  Patient presents with  . Teeth/jaw pain and chest heaviness    History of Present Illness:     Timothy Gentry is a 59 year old Caucasian male with a past medical history of hypertension, diabetes, hyperlipidemia, and remote tobacco abuse who presented to [facility] on 09/15/2015 with complaints of tooth/jaw pain. He also had complaints of chest heaviness/discomfort intermittently over the same time period. He states he did yard work and washed cars on Saturday. Afterward, he had tooth pain, some shortness of breath, and chest discomfort. On Sunday, he was more short of breath with exertion and very tired All of these symptoms began abruptly.  He has been followed regularly for hypertension, hyperlipidemia, and type 2 diabetes. He states that all of these chronic problems of been well controlled on medical therapy.  After arrival in the emergency department yesterday evening, the patient was given sublingual nitroglycerin with improvement in his tooth/jaw pain. He then stated his breathing was improved.   EKG was not diagnostic for a STEMI but there were dynamic changed with improvement in ST elevation after the administration of NTG. Initial Troponin I was 6.61 and increased to 7.22.  He ruled in for a NSTEMI. He underwent an emergent cardiac catheterization by Dr. Burt Knack on 09/15/2015. Results showed severe 3 vessel coronary artery disease (RCA 100% stenosed, proximal Circumflex 90% stenosed, proximal and mid LAD 75% stenosed, mid LAD 90% stenosed, and distal LAD 95% stenosed, and LVEF 45%.  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 He denies any bleeding problems.   TCTS was consulted for consideration  of coronary artery bypass grating surgery. He currently has no chest pain, is on a heparin drip, and vital signs are stable.  Current Activity/ Functional Status: Patient is independent with mobility/ambulation, transfers, ADL's, IADL's.   Zubrod Score: At the time of surgery this patient's most appropriate activity status/level should be described as: [x]     0    Normal activity, no symptoms []     1    Restricted in physical strenuous activity but ambulatory, able to do out light work []     2    Ambulatory and capable of self care, unable to do work activities, up and about more than 50%  Of the time          []     3    Only limited self care, in bed greater than 50% of waking hours []     4    Completely disabled, no self care, confined to bed or chair []     5    Moribund  Past Medical History  Diagnosis Date  . Hematuria   . Diabetes mellitus without complication ([place])     Type II  . Hyperlipidemia   . Hypertension     Not adequately controlled  . Colon polyps   . Degenerative disc disease, cervical   . Chronic kidney disease     Chronic, Stage III  . Colon ulcer     Possible NSAID induced  . Cancer ([place])     skin    Past  Surgical History  Procedure Laterality Date  . Revision total hip arthroplasty  2010  . Colon surgery  09/18/2009    colonoscopy  . Knee surgery Right 1983    Open repair of meniscus of knee  . Lumbar laminectomy  2001  . Lumbar fusion  2002  . Colonoscopy N/A 09/24/2014    Procedure: COLONOSCOPY;  Surgeon: Mayme Genta, MD;  Location: [facility] ENDOSCOPY;  Service: Endoscopy;  Laterality: N/A;  . Esophagogastroduodenoscopy N/A 09/24/2014    Procedure: ESOPHAGOGASTRODUODENOSCOPY (EGD);  Surgeon: Mayme Genta, MD;  Location: [facility];  Service: Endoscopy;  Laterality: N/A;  . 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 Also, had a ventral hernia repair at this time.   Social History   Social History  . Marital Status: Married    Spouse Name: N/A  . Number of Children: N/A  . Years of Education: N/A    Social History Main Topics  . Smoking status: Former Smoker    Types: Cigarettes    Quit date: 03/30/1979  . Smokeless tobacco: Not on file  . Alcohol Use: Yes  . Drug Use: No  . Sexual Activity: Not on file    Social History Narrative  He is married and has 2 sons. One of his sons lives in [place] and one lives in [place]. He has 3 grand daughters.  Allergies  Allergen Reactions  . Celebrex [Celecoxib] Other (See Comments)    Patient passed out    Current Facility-Administered Medications  Medication Dose Route Frequency Provider Last Rate Last Dose  . 0.9 %  sodium chloride infusion  250 mL Intravenous PRN Peter M Martinique, MD 10 mL/hr at 09/16/15 0000 250 mL at 09/16/15 0000  . acetaminophen (TYLENOL) tablet 650 mg  650 mg Oral Q4H PRN Peter M Martinique, MD      . allopurinol (ZYLOPRIM) tablet 150 mg  150 mg Oral Daily Sherren Mocha, MD      . allopurinol (ZYLOPRIM) tablet 300 mg  300 mg Oral QHS Sherren Mocha, MD   300 mg at 09/15/15 2139  . aspirin EC tablet 81 mg  81 mg Oral Daily Sherren Mocha, MD      . atorvastatin (LIPITOR) tablet 80 mg  80 mg Oral q1800 Peter M Martinique, MD      . heparin ADULT infusion 100 units/mL (25000 units/250 mL)  1,200 Units/hr Intravenous Continuous Priscella Mann, RPH 12 mL/hr at 09/16/15 0325 1,200 Units/hr at 09/16/15 0325  . hydrochlorothiazide (MICROZIDE) capsule 12.5 mg  12.5 mg Oral Daily Sherren Mocha, MD      . Influenza vac split quadrivalent PF (FLUARIX) injection 0.5 mL  0.5 mL Intramuscular Tomorrow-1000 Peter M Martinique, MD      . insulin aspart (novoLOG) injection 0-15 Units  0-15 Units Subcutaneous TID [facility] Sherren Mocha, MD   0 Units at  09/16/15 0800  . losartan (COZAAR) tablet 100 mg  100 mg Oral Daily Sherren Mocha, MD      . metoprolol tartrate (LOPRESSOR) tablet 12.5 mg  12.5 mg Oral BID Sherren Mocha, MD   12.5 mg at 09/15/15 2139  . multivitamin with minerals tablet 1 tablet  1 tablet Oral Daily Sherren Mocha, MD      . nitroGLYCERIN (NITROSTAT) SL tablet 0.4 mg  0.4 mg Sublingual Q5 Min x 3 PRN Sherren Mocha, MD      . ondansetron [facility]) injection 4 mg  4 mg Intravenous Q6H PRN Peter M Martinique, MD      . sodium chloride 0.9 % injection 3 mL  3 mL Intravenous Q12H Peter M Martinique, MD   3 mL at 09/15/15 2345  . sodium chloride 0.9 % injection 3 mL  3 mL Intravenous PRN Peter M Martinique, MD        Prescriptions prior to admission  Medication Sig Dispense Refill Last Dose  . allopurinol (ZYLOPRIM) 300 MG tablet Take 150-300 mg by mouth 2 (two) times daily. 150 mg in the morning and 300 mg in the evening.   09/15/2015 at Unknown time  . aspirin 81 MG tablet Take 81 mg by mouth daily.   09/14/2015 at Unknown time  . hydrochlorothiazide (HYDRODIURIL) 12.5 MG tablet Take 12.5 mg by mouth daily.   09/15/2015 at Unknown time  . losartan (COZAAR) 100 MG tablet Take 100 mg by mouth daily.   09/15/2015 at Unknown time  . metFORMIN (GLUCOPHAGE-XR) 500 MG 24 hr tablet Take 500 mg by mouth 2 (two) times daily.    09/15/2015 at Unknown time  . Multiple Vitamin (MULTIVITAMIN) tablet Take 1 tablet by mouth daily.   09/15/2015 at Unknown time  . pravastatin (PRAVACHOL) 80 MG tablet Take 80 mg by mouth every evening.    09/14/2015 at Unknown time   Family History  Problem Relation Age of Onset  . Cancer Father     myeloma    Review of Systems:     Cardiac Review of Systems: Y or N  Chest Pain [ N  ]  Resting SOB [ N  ] Exertional SOB  [Y  ] Orthopnea [ N ] Pedal Edema [ N  ]    Palpitations [ N  Syncope  [  N]  Presyncope [  N ]  General Review of Systems: [Y] = yes [ N ]=no Constitional: fatigue [ Y ]; nausea [ N ]; night sweats [ N ];  fever [ N ]; or chills [ N ]                                                        Eye : blurred vision Aqua.Slicker  ]; diplopia [  N ] Resp: cough Aqua.Slicker  ];  wheezing[ N ];  hemoptysis[ N ];   GI:  vomiting[ N ];  dysphagia[ N ]; melena[ N ]  hematochezia [ N ] GU: kidney stones [ N ]; hematuria[ N ];   dysuria [ N ];  nocturia[ N ]             Skin: rash, swelling[ N ];or itching[ N ]; Musculosketetal: myalgias[N  ];  joint swelling[N  ];  joint erythema[ N ]  Heme/Lymph: bruising[ N ];  bleeding[ N ];  Anemia                  [N];  Neuro: Sharlene.Ates  ]; stroke[ N ];  vertigo[ N ];  seizures[  N]  Psych:depression[ N ]; anxiety[ N ];  Endocrine: diabetes[Y  ];  thyroid dysfunction[ N ];    Physical Exam: BP 102/74 mmHg  Pulse 65  Temp(Src) 98.5 F (36.9 C) (  Oral)  Resp 19  Ht 5\' 10"  (1.778 m)  Wt 230 lb 9.6 oz (104.6 kg)  BMI 33.09 kg/m2  SpO2 95%   General appearance: alert, cooperative and no distress Head: Normocephalic, without obvious abnormality, atraumatic Neck: no carotid bruit, no JVD and supple, symmetrical, trachea midline Resp: clear to auscultation bilaterally Cardio: regular rate and rhythm, S1, S2 normal, no murmur, click, rub or gallop GI: soft, non-tender; bowel sounds normal; no masses,  no organomegaly. Well healed mid line scar (from e lap and ventral hernia scar) Extremities: No LE edema or cyanosis Neurologic: Grossly normal  Diagnostic Studies & Laboratory data:    Coronary Findings    Dominance: Right   Left Main  Vessel was injected. Vessel is normal in caliber.     Left Anterior Descending  . Vessel is large.   . Prox LAD to Mid LAD lesion, 75% stenosed. Calcified diffuse.   . Mid LAD lesion, 90% stenosed. Discrete.   Jorene Minors LAD lesion, 95% stenosed. Discrete.     Left Circumflex   . Prox Cx lesion, 90% stenosed. located at the major branch.     Right Coronary Artery  . Vessel is large.   . Prox RCA to Mid RCA lesion, 99% stenosed. located at the  bend.   . Mid RCA lesion, 100% stenosed.   . Right Posterior Descending Artery   RPDA filled by collaterals from 3rd Sept.      Wall Motion                 Left Heart    Left Ventricle The left ventricular size is normal. There is mild left ventricular systolic dysfunction. The left ventricular ejection fraction is 45-50% by visual estimate. There are wall motion abnormalities in the left ventricle. There are segmental wall motion abnormalities in the left ventricle.    Coronary Diagrams    Diagnostic Diagram               Recent Radiology Findings:    Dg Chest 2 View  09/15/2015  CLINICAL DATA:  Chest pain for 2 days.  Right colon block CT. EXAM: CHEST  2 VIEW COMPARISON:  09/22/2014 FINDINGS: The heart size and mediastinal contours are within normal limits. Both lungs are clear. The visualized skeletal structures are unremarkable. IMPRESSION: No active cardiopulmonary disease. Electronically Signed   By: Earle Gell M.D.   On: 09/15/2015 18:51     I have independently reviewed the above radiologic studies.  Recent Lab Findings: Lab Results  Component Value Date   WBC 15.6* 09/16/2015   HGB 14.0 09/16/2015   HCT 42.4 09/16/2015   PLT 355 09/16/2015   GLUCOSE 140* 09/16/2015   CHOL 141 09/16/2015   TRIG 117 09/16/2015   HDL 34* 09/16/2015   LDLCALC 84 09/16/2015   ALT 14 09/22/2014   AST 23 09/22/2014   NA 137 09/16/2015   K 3.8 09/16/2015   CL 105 09/16/2015   CREATININE 1.12 09/16/2015   BUN 10 09/16/2015   CO2 26 09/16/2015   Assessment / Plan:      1. S/p NSTEMI-multivessel CAD. On Heparin drip. Needs coronary artery bypass grafting surgery 2. Hypertension-on HCTZ 12.5 mg daily, Cozaar 100 mg daily, and Lopressor 12.5 mg bid. 3. Hyperlipidemia-on Lipitor 80 mg daily 4. Diabetes Mellitus Type 2-HGA1C ordered   I  spent 15 minutes counseling the patient face to face and 50% or more the  time was spent in counseling and coordination of care.  The  total time spent in the appointment was 45 minutes.  Timothy Pinks PA-C  09/16/2015 9:51 AM    I have seen and examined the patient and agree with the assessment and plan as outlined.  I have personally reviewed the patient's history, physical exam and diagnostic cardiac catheterization.  Patient presents with an acute non-ST segment elevation myocardial infarction and diagnostic cardiac catheterization demonstrates the presence of severe three-vessel coronary artery disease with mild left ventricular systolic dysfunction. I agree the patient would best be treated with surgical revascularization.  I have reviewed the indications, risks, and potential benefits of coronary artery bypass grafting with the patient and his wife.  Alternative treatment strategies have been discussed, including the relative risks, benefits and long term prognosis associated with medical therapy, percutaneous coronary intervention, and surgical revascularization.  The patient understands and accepts all potential associated risks of surgery including but not limited to risk of death, stroke or other neurologic complication, myocardial infarction, congestive heart failure, respiratory failure, renal failure, bleeding requiring blood transfusion and/or reexploration, aortic dissection or other major vascular complication, arrhythmia, heart block or bradycardia requiring permanent pacemaker, pneumonia, pleural effusion, wound infection, pulmonary embolus or other thromboembolic complication, chronic pain or other delayed complications related to median sternotomy, or the late recurrence of symptomatic ischemic heart disease and/or congestive heart failure.  The importance of long term risk modification have been emphasized.  All questions answered.  For OR tomorrow.  I spent in excess of 60 minutes during the conduct of this hospital encounter and >50% of this time involved direct face-to-face encounter with the patient for  counseling and/or coordination of their care.   Rexene Alberts, MD 09/16/2015 12:05PM

## 2015-09-16 NOTE — Progress Notes (Signed)
[place] for Heparin Indication: chest pain/ACS  Allergies  Allergen Reactions  . Celebrex [Celecoxib] Other (See Comments)    Patient passed out   Patient Measurements: Height: 5\' 10"  (177.8 cm) Weight: 230 lb 9.6 oz (104.6 kg) IBW/kg (Calculated) : 73  Height 178 cm IBW 73.2 kg Heparin Dosing Weight: 94.2 kg  Vital Signs: Temp: 98.5 F (36.9 C) (12/06 0726) Temp Source: Oral (12/06 0726) BP: 102/74 mmHg (12/06 0800) Pulse Rate: 65 (12/06 0800) Labs:  Recent Labs  09/15/15 1800 09/15/15 2107 09/16/15 0245 09/16/15 0910 09/16/15 0930  HGB 13.8  --  14.0  --   --   HCT 42.1  --  42.4  --   --   PLT 364  --  355  --   --   HEPARINUNFRC  --   --   --   --  0.10*  CREATININE 1.12  --  1.12  --   --   TROPONINI  --  6.61* 7.22* 6.57*  --    Estimated Creatinine Clearance: 86 mL/min (by C-G formula based on Cr of 1.12).   Medical History: Past Medical History  Diagnosis Date  . Hematuria   . Diabetes mellitus without complication ([place])     Type II  . Hyperlipidemia   . Hypertension     Not adequately controlled  . Colon polyps   . Degenerative disc disease, cervical   . Chronic kidney disease     Chronic, Stage III  . Colon ulcer     Possible NSAID induced  . Cancer [facility])     skin   Assessment: 59 year old male s/p emergent cardiac cath found to have severe 3 vessel disease awaiting CABG to start IV heparin per pharmacy dosing.  Initial heparin level is below goal at 0.1. No bleeding issues noted, cbc appears stable. CVTS consult pending for possible CABG.  Goal of Therapy:  Heparin level 0.3-0.7 units/ml Monitor platelets by anticoagulation protocol: Yes   Plan:  Increase IV heparin to 1500 units/hr Heparin level in 6 hours.  Daily heparin level and CBC. Follow up surgical plans  Erin Hearing PharmD., BCPS Clinical Pharmacist Pager [phone] 09/16/2015 11:24 AM

## 2015-09-16 NOTE — Progress Notes (Signed)
Patient Name: FANUEL DORNBUSH Date of Encounter: 09/16/2015  Principal Problem:   NSTEMI (non-ST elevated myocardial infarction) [facility]) Active Problems:   HTN (hypertension)   Hyperlipemia   Diabetes mellitus type 2 in obese [facility])   Primary Cardiologist: Dr Burt Knack  Patient Profile: 59 yo male w/ HTN, HL, DM, admitted 12/05 w/ NSTEMI, s/p cath w/ CABG planned. TCTS to see.  SUBJECTIVE: Some chest pain last pm, none this am, feels well now. No SOB.  OBJECTIVE Filed Vitals:   09/16/15 0600 09/16/15 0700 09/16/15 0726 09/16/15 0800  BP: 102/72 106/71  102/74  Pulse: 66 68  65  Temp:   98.5 F (36.9 C)   TempSrc:   Oral   Resp: 21 21  19   Height:      Weight:      SpO2: 92% 96%  95%    Intake/Output Summary (Last 24 hours) at 09/16/15 0818 Last data filed at 09/16/15 0800  Gross per 24 hour  Intake 1897.65 ml  Output    425 ml  Net 1472.65 ml   Filed Weights   09/15/15 1937 09/15/15 2003  Weight: 221 lb 12.5 oz (100.6 kg) 230 lb 9.6 oz (104.6 kg)    PHYSICAL EXAM General: Well developed, well nourished, male in no acute distress. Head: Normocephalic, atraumatic.  Neck: Supple without bruits, JVD not elevated. Lungs:  Resp regular and unlabored, CTA. Heart: RRR, S1, S2, no S3, S4, or murmur; no rub. Abdomen: Soft, non-tender, non-distended, BS + x 4.  Extremities: No clubbing, cyanosis, edema. R radial cath site without ecchymosis or hematoma.  Neuro: Alert and oriented X 3. Moves all extremities spontaneously. Psych: Normal affect.  LABS: CBC: Recent Labs  09/15/15 1800 09/16/15 0245  WBC 16.1* 15.6*  NEUTROABS 11.3*  --   HGB 13.8 14.0  HCT 42.1 42.4  MCV 85.9 86.2  PLT 364 [id number]   Basic Metabolic Panel: Recent Labs  09/15/15 1800 09/16/15 0245  NA 138 137  K 3.9 3.8  CL 103 105  CO2 27 26  GLUCOSE 119* 140*  BUN 12 10  CREATININE 1.12 1.12  CALCIUM 9.2 8.8*   Cardiac Enzymes: Recent Labs  09/15/15 2107 09/16/15 0245  TROPONINI  6.61* 7.22*    Recent Labs  09/15/15 1808  TROPIPOC 5.65*   D-dimer: Recent Labs  09/15/15 1800  DDIMER 0.39   Fasting Lipid Panel: Recent Labs  09/16/15 0245  CHOL 141  HDL 34*  LDLCALC 84  TRIG 117  CHOLHDL 4.1   TELE: SR, No sig ectopy       CATH: 12/06   Prox RCA to Mid RCA lesion, 99% stenosed.  Mid RCA lesion, 100% stenosed.  Prox Cx lesion, 90% stenosed.  Prox LAD to Mid LAD lesion, 75% stenosed.  Mid LAD lesion, 90% stenosed.  Dist LAD lesion, 95% stenosed.  There is mild left ventricular systolic dysfunction. 1. Severe 3 vessel obstructive CAD  -diffuse calcific 75% disease in the mid LAD. 90% stenosis in the mid LAD immediately after the second diagonal. 95% in the mid LAD  - 90% proximal LCx at the takeoff of the first OM which then bifurcates.  - 100% mid RCA with collaterals.  2. Mild LV dysfunction with EF 45% Plan: Recommend CABG. Patient is currently pain free. Will transfer to unit for medical therapy. Dr. Servando Snare consulted.   Radiology/Studies: Dg Chest 2 View  09/15/2015  CLINICAL DATA:  Chest pain for 2 days.  Right colon block  CT. EXAM: CHEST  2 VIEW COMPARISON:  09/22/2014 FINDINGS: The heart size and mediastinal contours are within normal limits. Both lungs are clear. The visualized skeletal structures are unremarkable. IMPRESSION: No active cardiopulmonary disease. Electronically Signed   By: Earle Gell M.D.   On: 09/15/2015 18:51     Current Medications:  . allopurinol  150 mg Oral Daily  . allopurinol  300 mg Oral QHS  . aspirin EC  81 mg Oral Daily  . atorvastatin  80 mg Oral q1800  . hydrochlorothiazide  12.5 mg Oral Daily  . Influenza vac split quadrivalent PF  0.5 mL Intramuscular Tomorrow-1000  . insulin aspart  0-15 Units Subcutaneous TID WC  . losartan  100 mg Oral Daily  . metoprolol tartrate  12.5 mg Oral BID  . multivitamin with minerals  1 tablet Oral Daily  . sodium chloride  3 mL Intravenous Q12H   .  heparin 1,200 Units/hr (09/16/15 0325)    ASSESSMENT AND PLAN: Principal Problem:   NSTEMI (non-ST elevated myocardial infarction) ([place]) - s/p cath, to be seen by TCTS for consideration of CABG.  - continue ASA, statin, BB, ARB.  Active Problems:   HTN (hypertension) - BP stable on current rx    Hyperlipemia - on high-dose Lipitor    Diabetes mellitus type 2 in obese ([place]) - on SSI, follow CBGs  Plan - tx telemetry, await surgical consult for timing of CABG. Continue heparin for now.  Signed, Lenoard Aden 8:18 AM 09/16/2015  Patient seen, examined. Available data reviewed. Agree with findings, assessment, and plan as outlined by Rosaria Ferries, PA-C. Cath findings noted. Pt now CP-free. Dr Roxy Manns has seen him and plans CABG tomorrow. Meds reviewed and patient on ASA, high-intensity statin, beta-blocker, ARB, and IV heparin. Troponins flat with peak of 7.2. Mild LV dysfunction by cath with LVEF 45%, but no clinical signs of heart failure.  Sherren Mocha, M.D. 09/16/2015 12:39 PM

## 2015-09-16 NOTE — Progress Notes (Signed)
CARDIAC REHAB PHASE I   PRE:  Rate/Rhythm: 67 SR  BP:  Sitting: 102/67        SaO2: 96 RA  MODE:  Ambulation: 700 ft   POST:  Rate/Rhythm: 83 SR  BP:  Sitting: 107/68         SaO2: 97 RA  Pt eager to walk. Pt ambulated 700 ft on RA, independent, IV, steady gait, tolerated well. Pt denies CP, dizziness, DOE, declined rest stop. Completed [facility] pre-op education with pt and wife at bedside. Reviewed IS, sternal precautions, activity progression, cardiac surgery book, and OHS guidelines. Gave instructions for cardiac surgery videos. Pt and wife verbalized understanding, very pleasant, very receptive to education. Pt to recliner after walk, call bell within reach. Will follow.   [id number]  Lenna Sciara, RN, BSN 09/16/2015 11:31 AM

## 2015-09-16 NOTE — Progress Notes (Signed)
Pre-op Cardiac Surgery  Carotid Findings:     Findings suggest 1-39% internal carotid artery stenosis bilaterally. Vertebral arteries are patent with antegrade flow.  09/16/2015 3:29 PM Bryna Razavi, RVT, RDCS, RDMS   Limb dopplers pending. Upper Extremity Right Left  Brachial Pressures    Radial Waveforms    Ulnar Waveforms    Palmar Arch (Allen's Test)     Findings:      Lower  Extremity Right Left  Dorsalis Pedis    Anterior Tibial    Posterior Tibial    Ankle/Brachial Indices      Findings:

## 2015-09-16 NOTE — Progress Notes (Signed)
[place] for Heparin Indication: chest pain/ACS  Allergies  Allergen Reactions  . Celebrex [Celecoxib] Other (See Comments)    Patient passed out   Patient Measurements: Height: 5\' 10"  (177.8 cm) Weight: 230 lb 9.6 oz (104.6 kg) IBW/kg (Calculated) : 73  Height 178 cm IBW 73.2 kg Heparin Dosing Weight: 94.2 kg  Vital Signs: Temp: 98.6 F (37 C) (12/06 1200) Temp Source: Oral (12/06 1200) BP: 95/58 mmHg (12/06 1400) Pulse Rate: 74 (12/06 1400) Labs:  Recent Labs  09/15/15 1800 09/15/15 2107 09/16/15 0245 09/16/15 0910 09/16/15 0930 09/16/15 1220 09/16/15 1912  HGB 13.8  --  14.0  --   --   --   --   HCT 42.1  --  42.4  --   --   --   --   PLT 364  --  355  --   --   --   --   APTT  --   --   --   --   --  49*  --   LABPROT  --   --   --   --   --  14.4  --   INR  --   --   --   --   --  1.10  --   HEPARINUNFRC  --   --   --   --  0.10*  --  0.16*  CREATININE 1.12  --  1.12  --   --  1.20  --   TROPONINI  --  6.61* 7.22* 6.57*  --   --   --    Estimated Creatinine Clearance: 80.3 mL/min (by C-G formula based on Cr of 1.2).   Medical History: Past Medical History  Diagnosis Date  . Hematuria   . Diabetes mellitus without complication ([place])     Type II  . Hyperlipidemia   . Hypertension   . Colon polyps   . Degenerative disc disease, cervical   . Chronic kidney disease     Chronic, Stage III  . Colon ulcer     Possible NSAID induced  . Cancer ([place])     skin  . NSTEMI (non-ST elevated myocardial infarction) ([place]) 09/15/2015  . Coronary artery disease 09/15/2015  . Benign neoplasm of pancreas 03/29/2014   Assessment: 59 year old male s/p emergent cardiac cath found to have severe 3 vessel disease awaiting CABG to start IV heparin per pharmacy dosing.  Heparin level is rising at 0.16 but remains sub-therapeutic on 1500 units/hr.   Goal of Therapy:  Heparin level 0.3-0.7 units/ml Monitor platelets by anticoagulation  protocol: Yes   Plan:  Bolus heparin 2800 units x1 Increase IV heparin to 1800 units/hr Heparin level in 6 hours - if plan to continue or CABG cancelled.  Follow up surgical plans - currently scheduled to stop at 0400 AM for CABG tomorrow.   Sloan Leiter, PharmD, BCPS Clinical Pharmacist [phone]  09/16/2015 8:02 PM

## 2015-09-17 ENCOUNTER — Encounter (HOSPITAL_COMMUNITY)
Admission: EM | Disposition: A | Discharge status: Home / Self Care | Payer: Managed Care, Other (non HMO) | Source: Home / Self Care | Attending: Thoracic Surgery (Cardiothoracic Vascular Surgery)

## 2015-09-17 ENCOUNTER — Inpatient Hospital Stay (HOSPITAL_COMMUNITY): Payer: Managed Care, Other (non HMO)

## 2015-09-17 ENCOUNTER — Encounter (HOSPITAL_COMMUNITY): Payer: Self-pay | Admitting: Certified Registered Nurse Anesthetist

## 2015-09-17 ENCOUNTER — Inpatient Hospital Stay (HOSPITAL_COMMUNITY): Payer: Managed Care, Other (non HMO) | Admitting: Certified Registered Nurse Anesthetist

## 2015-09-17 DIAGNOSIS — Z951 Presence of aortocoronary bypass graft: Secondary | ICD-10-CM

## 2015-09-17 HISTORY — PX: CORONARY ARTERY BYPASS GRAFT: SHX141

## 2015-09-17 HISTORY — DX: Presence of aortocoronary bypass graft: Z95.1

## 2015-09-17 HISTORY — PX: TEE WITHOUT CARDIOVERSION: SHX5443

## 2015-09-17 LAB — CBC
HCT: 39.9 % (ref 39.0–52.0)
HCT: 36.3 % — ABNORMAL LOW (ref 39.0–52.0)
HEMATOCRIT: 34.4 % — AB (ref 39.0–52.0)
HEMOGLOBIN: 12.8 g/dL — AB (ref 13.0–17.0)
HEMOGLOBIN: 11.8 g/dL — AB (ref 13.0–17.0)
HEMOGLOBIN: 12.2 g/dL — AB (ref 13.0–17.0)
MCH: 27.8 pg (ref 26.0–34.0)
MCH: 29.3 pg (ref 26.0–34.0)
MCH: 28.8 pg (ref 26.0–34.0)
MCHC: 32.1 g/dL (ref 30.0–36.0)
MCHC: 34.3 g/dL (ref 30.0–36.0)
MCHC: 33.6 g/dL (ref 30.0–36.0)
MCV: 86.6 fL (ref 78.0–100.0)
MCV: 85.4 fL (ref 78.0–100.0)
MCV: 85.6 fL (ref 78.0–100.0)
PLATELETS: 239 10*3/uL (ref 150–400)
Platelets: 347 10*3/uL (ref 150–400)
Platelets: 211 10*3/uL (ref 150–400)
RBC: 4.61 MIL/uL (ref 4.22–5.81)
RBC: 4.03 MIL/uL — ABNORMAL LOW (ref 4.22–5.81)
RBC: 4.24 MIL/uL (ref 4.22–5.81)
RDW: 15.6 % — AB (ref 11.5–15.5)
RDW: 15.6 % — AB (ref 11.5–15.5)
RDW: 15.6 % — ABNORMAL HIGH (ref 11.5–15.5)
WBC: 16.3 10*3/uL — AB (ref 4.0–10.5)
WBC: 22.5 10*3/uL — AB (ref 4.0–10.5)
WBC: 19.3 10*3/uL — ABNORMAL HIGH (ref 4.0–10.5)

## 2015-09-17 LAB — POCT I-STAT, CHEM 8
BUN: 14 mg/dL (ref 6–20)
BUN: 12 mg/dL (ref 6–20)
BUN: 13 mg/dL (ref 6–20)
BUN: 12 mg/dL (ref 6–20)
BUN: 13 mg/dL (ref 6–20)
BUN: 15 mg/dL (ref 6–20)
CALCIUM ION: 1.18 mmol/L (ref 1.12–1.23)
CALCIUM ION: 1.08 mmol/L — AB (ref 1.12–1.23)
CALCIUM ION: 1.13 mmol/L (ref 1.12–1.23)
CHLORIDE: 100 mmol/L — AB (ref 101–111)
CREATININE: 0.7 mg/dL (ref 0.61–1.24)
CREATININE: 0.9 mg/dL (ref 0.61–1.24)
CREATININE: 0.6 mg/dL — AB (ref 0.61–1.24)
Calcium, Ion: 1.22 mmol/L (ref 1.12–1.23)
Calcium, Ion: 1.07 mmol/L — ABNORMAL LOW (ref 1.12–1.23)
Calcium, Ion: 1.07 mmol/L — ABNORMAL LOW (ref 1.12–1.23)
Chloride: 102 mmol/L (ref 101–111)
Chloride: 98 mmol/L — ABNORMAL LOW (ref 101–111)
Chloride: 99 mmol/L — ABNORMAL LOW (ref 101–111)
Chloride: 99 mmol/L — ABNORMAL LOW (ref 101–111)
Chloride: 107 mmol/L (ref 101–111)
Creatinine, Ser: 1 mg/dL (ref 0.61–1.24)
Creatinine, Ser: 0.8 mg/dL (ref 0.61–1.24)
Creatinine, Ser: 1 mg/dL (ref 0.61–1.24)
GLUCOSE: 136 mg/dL — AB (ref 65–99)
GLUCOSE: 145 mg/dL — AB (ref 65–99)
GLUCOSE: 150 mg/dL — AB (ref 65–99)
Glucose, Bld: 113 mg/dL — ABNORMAL HIGH (ref 65–99)
Glucose, Bld: 150 mg/dL — ABNORMAL HIGH (ref 65–99)
Glucose, Bld: 123 mg/dL — ABNORMAL HIGH (ref 65–99)
HCT: 32 % — ABNORMAL LOW (ref 39.0–52.0)
HCT: 38 % — ABNORMAL LOW (ref 39.0–52.0)
HCT: 32 % — ABNORMAL LOW (ref 39.0–52.0)
HEMATOCRIT: 43 % (ref 39.0–52.0)
HEMATOCRIT: 30 % — AB (ref 39.0–52.0)
HEMATOCRIT: 37 % — AB (ref 39.0–52.0)
HEMOGLOBIN: 10.9 g/dL — AB (ref 13.0–17.0)
HEMOGLOBIN: 10.2 g/dL — AB (ref 13.0–17.0)
HEMOGLOBIN: 10.9 g/dL — AB (ref 13.0–17.0)
HEMOGLOBIN: 12.6 g/dL — AB (ref 13.0–17.0)
Hemoglobin: 14.6 g/dL (ref 13.0–17.0)
Hemoglobin: 12.9 g/dL — ABNORMAL LOW (ref 13.0–17.0)
POTASSIUM: 3.7 mmol/L (ref 3.5–5.1)
POTASSIUM: 4.3 mmol/L (ref 3.5–5.1)
Potassium: 3.4 mmol/L — ABNORMAL LOW (ref 3.5–5.1)
Potassium: 3.6 mmol/L (ref 3.5–5.1)
Potassium: 4.7 mmol/L (ref 3.5–5.1)
Potassium: 4.7 mmol/L (ref 3.5–5.1)
SODIUM: 139 mmol/L (ref 135–145)
SODIUM: 138 mmol/L (ref 135–145)
Sodium: 137 mmol/L (ref 135–145)
Sodium: 139 mmol/L (ref 135–145)
Sodium: 134 mmol/L — ABNORMAL LOW (ref 135–145)
Sodium: 137 mmol/L (ref 135–145)
TCO2: 25 mmol/L (ref 0–100)
TCO2: 26 mmol/L (ref 0–100)
TCO2: 27 mmol/L (ref 0–100)
TCO2: 27 mmol/L (ref 0–100)
TCO2: 24 mmol/L (ref 0–100)
TCO2: 21 mmol/L (ref 0–100)

## 2015-09-17 LAB — POCT I-STAT 3, ART BLOOD GAS (G3+)
ACID-BASE DEFICIT: 4 mmol/L — AB (ref 0.0–2.0)
ACID-BASE DEFICIT: 1 mmol/L (ref 0.0–2.0)
ACID-BASE EXCESS: 2 mmol/L (ref 0.0–2.0)
Acid-base deficit: 2 mmol/L (ref 0.0–2.0)
BICARBONATE: 25.5 meq/L — AB (ref 20.0–24.0)
BICARBONATE: 24.8 meq/L — AB (ref 20.0–24.0)
BICARBONATE: 23.2 meq/L (ref 20.0–24.0)
Bicarbonate: 27.5 mEq/L — ABNORMAL HIGH (ref 20.0–24.0)
Bicarbonate: 19.9 mEq/L — ABNORMAL LOW (ref 20.0–24.0)
Bicarbonate: 23.2 mEq/L (ref 20.0–24.0)
O2 SAT: 99 %
O2 SAT: 95 %
O2 SAT: 98 %
O2 SAT: 95 %
O2 Saturation: 100 %
O2 Saturation: 98 %
PCO2 ART: 43.7 mmHg (ref 35.0–45.0)
PCO2 ART: 40.8 mmHg (ref 35.0–45.0)
PH ART: 7.397 (ref 7.350–7.450)
PH ART: 7.375 (ref 7.350–7.450)
PO2 ART: 165 mmHg — AB (ref 80.0–100.0)
PO2 ART: 81 mmHg (ref 80.0–100.0)
TCO2: 27 mmol/L (ref 0–100)
TCO2: 29 mmol/L (ref 0–100)
TCO2: 26 mmol/L (ref 0–100)
TCO2: 24 mmol/L (ref 0–100)
TCO2: 21 mmol/L (ref 0–100)
TCO2: 24 mmol/L (ref 0–100)
pCO2 arterial: 43.8 mmHg (ref 35.0–45.0)
pCO2 arterial: 40.4 mmHg (ref 35.0–45.0)
pCO2 arterial: 34.4 mmHg — ABNORMAL LOW (ref 35.0–45.0)
pCO2 arterial: 37.5 mmHg (ref 35.0–45.0)
pH, Arterial: 7.374 (ref 7.350–7.450)
pH, Arterial: 7.406 (ref 7.350–7.450)
pH, Arterial: 7.363 (ref 7.350–7.450)
pH, Arterial: 7.403 (ref 7.350–7.450)
pO2, Arterial: 423 mmHg — ABNORMAL HIGH (ref 80.0–100.0)
pO2, Arterial: 113 mmHg — ABNORMAL HIGH (ref 80.0–100.0)
pO2, Arterial: 76 mmHg — ABNORMAL LOW (ref 80.0–100.0)
pO2, Arterial: 107 mmHg — ABNORMAL HIGH (ref 80.0–100.0)

## 2015-09-17 LAB — CREATININE, SERUM
CREATININE: 1.11 mg/dL (ref 0.61–1.24)
GFR calc Af Amer: >60 mL/min (ref >60)

## 2015-09-17 LAB — PROTIME-INR
INR: 1.43 (ref 0.00–1.49)
Prothrombin Time: 17.5 seconds — ABNORMAL HIGH (ref 11.6–15.2)

## 2015-09-17 LAB — MAGNESIUM: Magnesium: 3.2 mg/dL — ABNORMAL HIGH (ref 1.7–2.4)

## 2015-09-17 LAB — PLATELET COUNT: Platelets: 185 10*3/uL (ref 150–400)

## 2015-09-17 LAB — POCT I-STAT 4, (NA,K, GLUC, HGB,HCT)
GLUCOSE: 130 mg/dL — AB (ref 65–99)
HCT: 34 % — ABNORMAL LOW (ref 39.0–52.0)
Hemoglobin: 11.6 g/dL — ABNORMAL LOW (ref 13.0–17.0)
POTASSIUM: 3.9 mmol/L (ref 3.5–5.1)
Sodium: 137 mmol/L (ref 135–145)

## 2015-09-17 LAB — HEMOGLOBIN AND HEMATOCRIT, BLOOD
HCT: 30.1 % — ABNORMAL LOW (ref 39.0–52.0)
Hemoglobin: 10.1 g/dL — ABNORMAL LOW (ref 13.0–17.0)

## 2015-09-17 LAB — HEMOGLOBIN A1C
Hgb A1c MFr Bld: 7 % — ABNORMAL HIGH (ref 4.8–5.6)
Hgb A1c MFr Bld: 7.3 % — ABNORMAL HIGH (ref 4.8–5.6)
Mean Plasma Glucose: 154 mg/dL
Mean Plasma Glucose: 163 mg/dL

## 2015-09-17 LAB — GLUCOSE, CAPILLARY: Glucose-Capillary: 99 mg/dL (ref 65–99)

## 2015-09-17 LAB — APTT: APTT: 36 s (ref 24–37)

## 2015-09-17 SURGERY — CORONARY ARTERY BYPASS GRAFTING (CABG)
Anesthesia: General | Site: Chest

## 2015-09-17 MED ORDER — LACTATED RINGERS IV SOLN
INTRAVENOUS | Status: DC | PRN
  Administered 2015-09-17: 08:00:00 via INTRAVENOUS

## 2015-09-17 MED ORDER — INSULIN REGULAR BOLUS VIA INFUSION
0.0000 [IU] | Freq: Three times a day (TID) | INTRAVENOUS | Status: DC
Start: 2015-09-17 — End: 2015-09-18
  Filled 2015-09-17: qty 10

## 2015-09-17 MED ORDER — DEXMEDETOMIDINE HCL IN NACL 200 MCG/50ML IV SOLN
0.0000 ug/kg/h | INTRAVENOUS | Status: DC
  Administered 2015-09-17: 0.7 ug/kg/h via INTRAVENOUS
  Administered 2015-09-18: 0.2 ug/kg/h via INTRAVENOUS
  Administered 2015-09-18: 0.02 ug/kg/h via INTRAVENOUS
  Filled 2015-09-17 (×2): qty 50

## 2015-09-17 MED ORDER — MUPIROCIN 2 % EX OINT: TOPICAL_OINTMENT | Freq: Two times a day (BID) | CUTANEOUS | Status: DC

## 2015-09-17 MED ORDER — METOPROLOL TARTRATE 1 MG/ML IV SOLN: 2.5000 mg | INTRAVENOUS | Status: DC | PRN

## 2015-09-17 MED ORDER — ACETAMINOPHEN 160 MG/5ML PO SOLN: 1000.0000 mg | Freq: Four times a day (QID) | ORAL | Status: DC

## 2015-09-17 MED ORDER — CHLORHEXIDINE GLUCONATE CLOTH 2 % EX PADS
6.0000 | MEDICATED_PAD | Freq: Every day | CUTANEOUS | Status: DC
Start: 2015-09-17 — End: 2015-09-21
  Administered 2015-09-17 – 2015-09-21 (×4): 6 via TOPICAL

## 2015-09-17 MED ORDER — DEXTROSE 5 % IV SOLN
1.5000 g | Freq: Two times a day (BID) | INTRAVENOUS | Status: AC
  Administered 2015-09-17 – 2015-09-19 (×4): 1.5 g via INTRAVENOUS
  Filled 2015-09-17 (×4): qty 1.5

## 2015-09-17 MED ORDER — SODIUM CHLORIDE 0.9 % IV SOLN
INTRAVENOUS | Status: DC
  Filled 2015-09-17: qty 40

## 2015-09-17 MED ORDER — SODIUM CHLORIDE 0.9 % IJ SOLN
3.0000 mL | Freq: Two times a day (BID) | INTRAMUSCULAR | Status: DC
  Administered 2015-09-18 – 2015-09-20 (×6): 3 mL via INTRAVENOUS

## 2015-09-17 MED ORDER — DOCUSATE SODIUM 100 MG PO CAPS
200.0000 mg | ORAL_CAPSULE | Freq: Every day | ORAL | Status: DC
  Administered 2015-09-18 – 2015-09-21 (×4): 200 mg via ORAL
  Filled 2015-09-17 (×4): qty 2

## 2015-09-17 MED ORDER — MIDAZOLAM HCL 5 MG/5ML IJ SOLN
INTRAMUSCULAR | Status: DC | PRN
  Administered 2015-09-17: 2 mg via INTRAVENOUS
  Administered 2015-09-17 (×2): 1 mg via INTRAVENOUS
  Administered 2015-09-17 (×2): 2 mg via INTRAVENOUS

## 2015-09-17 MED ORDER — HEPARIN SODIUM (PORCINE) 1000 UNIT/ML IJ SOLN
INTRAMUSCULAR | Status: DC | PRN
  Administered 2015-09-17: 53000 [IU] via INTRAVENOUS

## 2015-09-17 MED ORDER — CHLORHEXIDINE GLUCONATE 0.12 % MT SOLN
15.0000 mL | Freq: Two times a day (BID) | OROMUCOSAL | Status: DC
  Administered 2015-09-17 – 2015-09-19 (×4): 15 mL via OROMUCOSAL
  Filled 2015-09-17 (×3): qty 15

## 2015-09-17 MED ORDER — FENTANYL CITRATE (PF) 250 MCG/5ML IJ SOLN
INTRAMUSCULAR | Status: AC
  Filled 2015-09-17: qty 20

## 2015-09-17 MED ORDER — FENTANYL CITRATE (PF) 100 MCG/2ML IJ SOLN
INTRAMUSCULAR | Status: DC | PRN
  Administered 2015-09-17: 150 ug via INTRAVENOUS
  Administered 2015-09-17: 50 ug via INTRAVENOUS
  Administered 2015-09-17: 100 ug via INTRAVENOUS
  Administered 2015-09-17: 50 ug via INTRAVENOUS
  Administered 2015-09-17: 100 ug via INTRAVENOUS
  Administered 2015-09-17: 50 ug via INTRAVENOUS
  Administered 2015-09-17: 150 ug via INTRAVENOUS
  Administered 2015-09-17: 50 ug via INTRAVENOUS
  Administered 2015-09-17 (×3): 100 ug via INTRAVENOUS

## 2015-09-17 MED ORDER — SODIUM CHLORIDE 0.9 % IV SOLN
INTRAVENOUS | Status: DC
  Administered 2015-09-17: 15:00:00 via INTRAVENOUS

## 2015-09-17 MED ORDER — DEXMEDETOMIDINE HCL IN NACL 200 MCG/50ML IV SOLN
0.4000 ug/kg/h | INTRAVENOUS | Status: DC
  Filled 2015-09-17: qty 50

## 2015-09-17 MED ORDER — CHLORHEXIDINE GLUCONATE 0.12 % MT SOLN
15.0000 mL | OROMUCOSAL | Status: AC
  Administered 2015-09-17: 15 mL via OROMUCOSAL

## 2015-09-17 MED ORDER — ARTIFICIAL TEARS OP OINT
TOPICAL_OINTMENT | OPHTHALMIC | Status: DC | PRN
  Administered 2015-09-17: 1 via OPHTHALMIC

## 2015-09-17 MED ORDER — SODIUM CHLORIDE 0.9 % IV SOLN
10.0000 g | INTRAVENOUS | Status: DC | PRN
  Administered 2015-09-17: 5 g/h via INTRAVENOUS

## 2015-09-17 MED ORDER — SODIUM CHLORIDE 0.9 % IV SOLN: INTRAVENOUS | Status: AC

## 2015-09-17 MED ORDER — SODIUM CHLORIDE 0.9 % IV SOLN
20.0000 mg | INTRAVENOUS | Status: DC | PRN
  Administered 2015-09-17: 20 ug/min via INTRAVENOUS

## 2015-09-17 MED ORDER — MAGNESIUM SULFATE 4 GM/100ML IV SOLN
4.0000 g | Freq: Once | INTRAVENOUS | Status: AC
  Administered 2015-09-17: 4 g via INTRAVENOUS
  Filled 2015-09-17: qty 100

## 2015-09-17 MED ORDER — ASPIRIN EC 325 MG PO TBEC
325.0000 mg | DELAYED_RELEASE_TABLET | Freq: Every day | ORAL | Status: DC
  Administered 2015-09-18 – 2015-09-21 (×4): 325 mg via ORAL
  Filled 2015-09-17 (×4): qty 1

## 2015-09-17 MED ORDER — METOPROLOL TARTRATE 12.5 MG HALF TABLET
12.5000 mg | ORAL_TABLET | Freq: Two times a day (BID) | ORAL | Status: DC
  Administered 2015-09-19 – 2015-09-21 (×4): 12.5 mg via ORAL
  Filled 2015-09-17 (×5): qty 1

## 2015-09-17 MED ORDER — SODIUM CHLORIDE 0.9 % IJ SOLN
OROMUCOSAL | Status: DC | PRN
  Administered 2015-09-17 (×4): 6 mL via TOPICAL

## 2015-09-17 MED ORDER — ROCURONIUM BROMIDE 100 MG/10ML IV SOLN
INTRAVENOUS | Status: DC | PRN
  Administered 2015-09-17: 40 mg via INTRAVENOUS
  Administered 2015-09-17: 30 mg via INTRAVENOUS
  Administered 2015-09-17: 20 mg via INTRAVENOUS
  Administered 2015-09-17: 60 mg via INTRAVENOUS
  Administered 2015-09-17: 10 mg via INTRAVENOUS

## 2015-09-17 MED ORDER — ACETAMINOPHEN 500 MG PO TABS
1000.0000 mg | ORAL_TABLET | Freq: Four times a day (QID) | ORAL | Status: DC
  Administered 2015-09-18 – 2015-09-21 (×14): 1000 mg via ORAL
  Filled 2015-09-17 (×14): qty 2

## 2015-09-17 MED ORDER — PROTAMINE SULFATE 10 MG/ML IV SOLN
INTRAVENOUS | Status: DC | PRN
  Administered 2015-09-17: 360 mg via INTRAVENOUS

## 2015-09-17 MED ORDER — LACTATED RINGERS IV SOLN
INTRAVENOUS | Status: DC
  Administered 2015-09-17: 15:00:00 via INTRAVENOUS

## 2015-09-17 MED ORDER — ANTISEPTIC ORAL RINSE SOLUTION (CORINZ)
7.0000 mL | Freq: Four times a day (QID) | OROMUCOSAL | Status: DC
Start: 2015-09-18 — End: 2015-09-19
  Administered 2015-09-18 – 2015-09-19 (×4): 7 mL via OROMUCOSAL

## 2015-09-17 MED ORDER — SODIUM CHLORIDE 0.45 % IV SOLN
INTRAVENOUS | Status: DC | PRN
  Administered 2015-09-17: 15:00:00 via INTRAVENOUS

## 2015-09-17 MED ORDER — PROPOFOL 10 MG/ML IV BOLUS
INTRAVENOUS | Status: DC | PRN
  Administered 2015-09-17: 80 mg via INTRAVENOUS

## 2015-09-17 MED ORDER — ALBUMIN HUMAN 5 % IV SOLN
INTRAVENOUS | Status: DC | PRN
  Administered 2015-09-17: 14:00:00 via INTRAVENOUS

## 2015-09-17 MED ORDER — SODIUM CHLORIDE 0.9 % IJ SOLN: 3.0000 mL | INTRAMUSCULAR | Status: DC | PRN

## 2015-09-17 MED ORDER — MIDAZOLAM HCL 10 MG/2ML IJ SOLN
INTRAMUSCULAR | Status: AC
  Filled 2015-09-17: qty 2

## 2015-09-17 MED ORDER — 0.9 % SODIUM CHLORIDE (POUR BTL) OPTIME
TOPICAL | Status: DC | PRN
  Administered 2015-09-17: 5000 mL

## 2015-09-17 MED ORDER — ONDANSETRON HCL 4 MG/2ML IJ SOLN: 4.0000 mg | Freq: Four times a day (QID) | INTRAMUSCULAR | Status: DC | PRN

## 2015-09-17 MED ORDER — PHENYLEPHRINE HCL 10 MG/ML IJ SOLN
0.0000 ug/min | INTRAVENOUS | Status: DC
  Administered 2015-09-17: 10 ug/min via INTRAVENOUS
  Administered 2015-09-18: 15 ug/min via INTRAVENOUS
  Filled 2015-09-17 (×2): qty 2

## 2015-09-17 MED ORDER — METOPROLOL TARTRATE 25 MG/10 ML ORAL SUSPENSION: 12.5000 mg | Freq: Two times a day (BID) | ORAL | Status: DC

## 2015-09-17 MED ORDER — ANTITHROMBIN III (HUMAN) 1000 UNITS IV SOLR
1072.0000 [IU] | INTRAVENOUS | Status: DC | PRN
  Filled 2015-09-17: qty 1

## 2015-09-17 MED ORDER — OXYCODONE HCL 5 MG PO TABS
5.0000 mg | ORAL_TABLET | ORAL | Status: DC | PRN
  Administered 2015-09-17: 5 mg via ORAL
  Administered 2015-09-19: 10 mg via ORAL
  Filled 2015-09-17: qty 1
  Filled 2015-09-17: qty 2

## 2015-09-17 MED ORDER — POTASSIUM CHLORIDE 10 MEQ/50ML IV SOLN
10.0000 meq | INTRAVENOUS | Status: AC
  Administered 2015-09-17 (×3): 10 meq via INTRAVENOUS

## 2015-09-17 MED ORDER — LACTATED RINGERS IV SOLN: INTRAVENOUS | Status: DC

## 2015-09-17 MED ORDER — ACETAMINOPHEN 160 MG/5ML PO SOLN: 650.0000 mg | Freq: Once | ORAL | Status: AC

## 2015-09-17 MED ORDER — BISACODYL 5 MG PO TBEC
10.0000 mg | DELAYED_RELEASE_TABLET | Freq: Every day | ORAL | Status: DC
  Administered 2015-09-18 – 2015-09-20 (×3): 10 mg via ORAL
  Filled 2015-09-17 (×3): qty 2

## 2015-09-17 MED ORDER — LACTATED RINGERS IV SOLN: 500.0000 mL | Freq: Once | INTRAVENOUS | Status: DC | PRN

## 2015-09-17 MED ORDER — MIDAZOLAM HCL 2 MG/2ML IJ SOLN: 2.0000 mg | INTRAMUSCULAR | Status: DC | PRN

## 2015-09-17 MED ORDER — SODIUM CHLORIDE 0.9 % IV SOLN
250.0000 [IU] | INTRAVENOUS | Status: DC | PRN
  Administered 2015-09-17: 1.7 [IU]/h via INTRAVENOUS

## 2015-09-17 MED ORDER — ALBUMIN HUMAN 5 % IV SOLN
250.0000 mL | INTRAVENOUS | Status: AC | PRN
  Administered 2015-09-17: 250 mL via INTRAVENOUS

## 2015-09-17 MED ORDER — PROPOFOL 10 MG/ML IV BOLUS
INTRAVENOUS | Status: AC
  Filled 2015-09-17: qty 20

## 2015-09-17 MED ORDER — MUPIROCIN 2 % EX OINT: 1.0000 "application " | TOPICAL_OINTMENT | Freq: Two times a day (BID) | CUTANEOUS | Status: DC

## 2015-09-17 MED ORDER — ACETAMINOPHEN 650 MG RE SUPP
650.0000 mg | Freq: Once | RECTAL | Status: AC
  Administered 2015-09-17: 650 mg via RECTAL

## 2015-09-17 MED ORDER — FAMOTIDINE IN NACL 20-0.9 MG/50ML-% IV SOLN
20.0000 mg | Freq: Two times a day (BID) | INTRAVENOUS | Status: DC
  Administered 2015-09-17: 20 mg via INTRAVENOUS

## 2015-09-17 MED ORDER — LIDOCAINE HCL (CARDIAC) 20 MG/ML IV SOLN
INTRAVENOUS | Status: DC | PRN
  Administered 2015-09-17: 80 mg via INTRAVENOUS

## 2015-09-17 MED ORDER — DEXMEDETOMIDINE HCL IN NACL 200 MCG/50ML IV SOLN
INTRAVENOUS | Status: DC | PRN
  Administered 2015-09-17: .3 ug/kg/h via INTRAVENOUS

## 2015-09-17 MED ORDER — TRAMADOL HCL 50 MG PO TABS
50.0000 mg | ORAL_TABLET | ORAL | Status: DC | PRN
  Administered 2015-09-19: 100 mg via ORAL
  Administered 2015-09-19 – 2015-09-20 (×2): 50 mg via ORAL
  Filled 2015-09-17: qty 2
  Filled 2015-09-17: qty 1
  Filled 2015-09-17: qty 2

## 2015-09-17 MED ORDER — MORPHINE SULFATE (PF) 2 MG/ML IV SOLN
2.0000 mg | INTRAVENOUS | Status: DC | PRN
  Administered 2015-09-17: 4 mg via INTRAVENOUS
  Administered 2015-09-18: 5 mg via INTRAVENOUS
  Administered 2015-09-18: 4 mg via INTRAVENOUS
  Filled 2015-09-17: qty 2
  Filled 2015-09-17: qty 3
  Filled 2015-09-17: qty 2

## 2015-09-17 MED ORDER — PANTOPRAZOLE SODIUM 40 MG PO TBEC
40.0000 mg | DELAYED_RELEASE_TABLET | Freq: Every day | ORAL | Status: DC
  Administered 2015-09-19 – 2015-09-21 (×3): 40 mg via ORAL
  Filled 2015-09-17 (×3): qty 1

## 2015-09-17 MED ORDER — SODIUM CHLORIDE 0.9 % IV SOLN
INTRAVENOUS | Status: DC
  Administered 2015-09-17: 2.8 [IU]/h via INTRAVENOUS
  Administered 2015-09-17: 0.4 [IU]/h via INTRAVENOUS
  Filled 2015-09-17 (×2): qty 2.5

## 2015-09-17 MED ORDER — VANCOMYCIN HCL IN DEXTROSE 1-5 GM/200ML-% IV SOLN
1000.0000 mg | Freq: Once | INTRAVENOUS | Status: AC
  Administered 2015-09-17: 1000 mg via INTRAVENOUS
  Filled 2015-09-17: qty 200

## 2015-09-17 MED ORDER — BISACODYL 10 MG RE SUPP: 10.0000 mg | Freq: Every day | RECTAL | Status: DC

## 2015-09-17 MED ORDER — SODIUM CHLORIDE 0.9 % IV SOLN: 250.0000 mL | INTRAVENOUS | Status: DC

## 2015-09-17 MED ORDER — CHLORHEXIDINE GLUCONATE CLOTH 2 % EX PADS: 6.0000 | MEDICATED_PAD | Freq: Every day | CUTANEOUS | Status: DC

## 2015-09-17 MED ORDER — MORPHINE SULFATE (PF) 2 MG/ML IV SOLN: 1.0000 mg | INTRAVENOUS | Status: AC | PRN

## 2015-09-17 MED ORDER — MUPIROCIN 2 % EX OINT
1.0000 "application " | TOPICAL_OINTMENT | Freq: Two times a day (BID) | CUTANEOUS | Status: DC
  Administered 2015-09-17 – 2015-09-21 (×9): 1 via NASAL
  Filled 2015-09-17 (×5): qty 22

## 2015-09-17 MED ORDER — ASPIRIN 81 MG PO CHEW: 324.0000 mg | CHEWABLE_TABLET | Freq: Every day | ORAL | Status: DC

## 2015-09-17 MED ORDER — NITROGLYCERIN IN D5W 200-5 MCG/ML-% IV SOLN
0.0000 ug/min | INTRAVENOUS | Status: DC
  Administered 2015-09-17: 20 ug/min via INTRAVENOUS

## 2015-09-17 MED FILL — Heparin Sodium (Porcine) Inj 1000 Unit/ML: INTRAMUSCULAR | Qty: 30 | Status: AC

## 2015-09-17 MED FILL — Sodium Chloride IV Soln 0.9%: INTRAVENOUS | Qty: 2000 | Status: AC

## 2015-09-17 MED FILL — Magnesium Sulfate Inj 50%: INTRAMUSCULAR | Qty: 10 | Status: AC

## 2015-09-17 MED FILL — Potassium Chloride Inj 2 mEq/ML: INTRAVENOUS | Qty: 40 | Status: AC

## 2015-09-17 MED FILL — Electrolyte-R (PH 7.4) Solution: INTRAVENOUS | Qty: 4000 | Status: AC

## 2015-09-17 MED FILL — Lidocaine HCl IV Inj 20 MG/ML: INTRAVENOUS | Qty: 5 | Status: AC

## 2015-09-17 MED FILL — Sodium Bicarbonate IV Soln 8.4%: INTRAVENOUS | Qty: 50 | Status: AC

## 2015-09-17 MED FILL — Heparin Sodium (Porcine) Inj 1000 Unit/ML: INTRAMUSCULAR | Qty: 10 | Status: AC

## 2015-09-17 SURGICAL SUPPLY — 99 items
ADAPTER CARDIO PERF ANTE/RETRO (ADAPTER) ×3 IMPLANT
BAG DECANTER FOR FLEXI CONT (MISCELLANEOUS) ×6 IMPLANT
BANDAGE ELASTIC 4 VELCRO ST LF (GAUZE/BANDAGES/DRESSINGS) ×3 IMPLANT
BANDAGE ELASTIC 6 VELCRO ST LF (GAUZE/BANDAGES/DRESSINGS) ×3 IMPLANT
BASKET HEART (ORDER IN 25'S) (MISCELLANEOUS) ×1
BASKET HEART (ORDER IN 25S) (MISCELLANEOUS) ×2 IMPLANT
BLADE STERNUM SYSTEM 6 (BLADE) ×3 IMPLANT
BLADE SURG ROTATE 9660 (MISCELLANEOUS) IMPLANT
BNDG GAUZE ELAST 4 BULKY (GAUZE/BANDAGES/DRESSINGS) ×3 IMPLANT
CANISTER SUCTION 2500CC (MISCELLANEOUS) ×3 IMPLANT
CANNULA EZ GLIDE AORTIC 21FR (CANNULA) ×6 IMPLANT
CANNULA GUNDRY RCSP 15FR (MISCELLANEOUS) ×3 IMPLANT
CATH CPB KIT OWEN (MISCELLANEOUS) ×3 IMPLANT
CATH THORACIC 36FR (CATHETERS) ×3 IMPLANT
CLIP FOGARTY SPRING 6M (CLIP) ×6 IMPLANT
CLIP TI MEDIUM 24 (CLIP) ×3 IMPLANT
CLIP TI WIDE RED SMALL 24 (CLIP) ×6 IMPLANT
CONN ST 1/4X3/8  BEN (MISCELLANEOUS) ×2
CONN ST 1/4X3/8 BEN (MISCELLANEOUS) ×4 IMPLANT
CRADLE DONUT ADULT HEAD (MISCELLANEOUS) ×3 IMPLANT
DERMABOND ADHESIVE PROPEN (GAUZE/BANDAGES/DRESSINGS) ×1
DERMABOND ADVANCED .7 DNX6 (GAUZE/BANDAGES/DRESSINGS) ×2 IMPLANT
DRAIN CHANNEL 32F RND 10.7 FF (WOUND CARE) ×6 IMPLANT
DRAPE CARDIOVASCULAR INCISE (DRAPES) ×1
DRAPE INCISE IOBAN 66X45 STRL (DRAPES) ×6 IMPLANT
DRAPE SLUSH/WARMER DISC (DRAPES) ×3 IMPLANT
DRAPE SRG 135X102X78XABS (DRAPES) ×2 IMPLANT
DRSG AQUACEL AG ADV 3.5X14 (GAUZE/BANDAGES/DRESSINGS) ×3 IMPLANT
DRSG COVADERM 4X14 (GAUZE/BANDAGES/DRESSINGS) ×3 IMPLANT
DRSG KUZMA FLUFF (GAUZE/BANDAGES/DRESSINGS) ×3 IMPLANT
ELECT BLADE 4.0 EZ CLEAN MEGAD (MISCELLANEOUS) ×3
ELECT REM PT RETURN 9FT ADLT (ELECTROSURGICAL) ×6
ELECTRODE BLDE 4.0 EZ CLN MEGD (MISCELLANEOUS) ×2 IMPLANT
ELECTRODE REM PT RTRN 9FT ADLT (ELECTROSURGICAL) ×4 IMPLANT
GAUZE SPONGE 4X4 12PLY STRL (GAUZE/BANDAGES/DRESSINGS) ×6 IMPLANT
GLOVE BIO SURGEON STRL SZ 6.5 (GLOVE) ×12 IMPLANT
GLOVE ORTHO TXT STRL SZ7.5 (GLOVE) ×6 IMPLANT
GLOVE SS N UNI LF 6.5 STRL (GLOVE) ×9 IMPLANT
GOWN STRL REUS W/ TWL LRG LVL3 (GOWN DISPOSABLE) ×16 IMPLANT
GOWN STRL REUS W/TWL LRG LVL3 (GOWN DISPOSABLE) ×8
HEMOSTAT POWDER SURGIFOAM 1G (HEMOSTASIS) ×9 IMPLANT
INSERT FOGARTY XLG (MISCELLANEOUS) ×3 IMPLANT
KIT BASIN OR (CUSTOM PROCEDURE TRAY) ×3 IMPLANT
KIT ROOM TURNOVER OR (KITS) ×3 IMPLANT
KIT SUCTION CATH 14FR (SUCTIONS) ×9 IMPLANT
KIT VASOVIEW W/TROCAR VH 2000 (KITS) ×3 IMPLANT
LEAD PACING MYOCARDI (MISCELLANEOUS) ×3 IMPLANT
LIQUID BAND (GAUZE/BANDAGES/DRESSINGS) ×3 IMPLANT
MARKER GRAFT CORONARY BYPASS (MISCELLANEOUS) ×9 IMPLANT
NS IRRIG 1000ML POUR BTL (IV SOLUTION) ×15 IMPLANT
PACK OPEN HEART (CUSTOM PROCEDURE TRAY) ×3 IMPLANT
PAD ARMBOARD 7.5X6 YLW CONV (MISCELLANEOUS) ×6 IMPLANT
PAD ELECT DEFIB RADIOL ZOLL (MISCELLANEOUS) ×3 IMPLANT
PENCIL BUTTON HOLSTER BLD 10FT (ELECTRODE) ×3 IMPLANT
PUNCH AORTIC ROT 4.0MM RCL 40 (MISCELLANEOUS) ×3 IMPLANT
SET CARDIOPLEGIA MPS 5001102 (MISCELLANEOUS) ×3 IMPLANT
SPONGE GAUZE 4X4 12PLY STER LF (GAUZE/BANDAGES/DRESSINGS) ×6 IMPLANT
STERNAL WIRE DOUBLE CC ×3 IMPLANT
SUT BONE WAX W31G (SUTURE) ×3 IMPLANT
SUT ETHIBOND 2 0 SH (SUTURE) ×4
SUT ETHIBOND 2 0 SH 36X2 (SUTURE) ×8 IMPLANT
SUT ETHIBOND X763 2 0 SH 1 (SUTURE) ×6 IMPLANT
SUT MNCRL AB 3-0 PS2 18 (SUTURE) ×6 IMPLANT
SUT PDS AB 1 CTX 36 (SUTURE) ×6 IMPLANT
SUT PROLENE 0 CTX (SUTURE) ×9 IMPLANT
SUT PROLENE 3 0 SH DA (SUTURE) ×3 IMPLANT
SUT PROLENE 4 0 RB 1 (SUTURE) ×3
SUT PROLENE 4 0 SH DA (SUTURE) ×3 IMPLANT
SUT PROLENE 4-0 RB1 .5 CRCL 36 (SUTURE) ×6 IMPLANT
SUT PROLENE 5 0 C 1 36 (SUTURE) ×3 IMPLANT
SUT PROLENE 6 0 C 1 30 (SUTURE) ×6 IMPLANT
SUT PROLENE 7.0 RB 3 (SUTURE) ×39 IMPLANT
SUT PROLENE 8 0 BV175 6 (SUTURE) ×6 IMPLANT
SUT PROLENE BLUE 7 0 (SUTURE) ×6 IMPLANT
SUT SILK  1 MH (SUTURE) ×1
SUT SILK 1 MH (SUTURE) ×2 IMPLANT
SUT SILK 1 TIES 10X30 (SUTURE) ×3 IMPLANT
SUT SILK 2 0 SH CR/8 (SUTURE) ×6 IMPLANT
SUT SILK 2 0 TIES 10X30 (SUTURE) ×3 IMPLANT
SUT SILK 2 0 TIES 17X18 (SUTURE) ×1
SUT SILK 2-0 18XBRD TIE BLK (SUTURE) ×2 IMPLANT
SUT SILK 3 0 SH CR/8 (SUTURE) ×3 IMPLANT
SUT SILK 4 0 TIE 10X30 (SUTURE) ×6 IMPLANT
SUT STEEL SZ 6 DBL 3X14 BALL (SUTURE) ×3 IMPLANT
SUT TEM PAC WIRE 2 0 SH (SUTURE) ×12 IMPLANT
SUT VIC AB 2-0 CT1 27 (SUTURE) ×1
SUT VIC AB 2-0 CT1 TAPERPNT 27 (SUTURE) ×2 IMPLANT
SUT VIC AB 2-0 CTX 27 (SUTURE) ×9 IMPLANT
SUT VIC AB 3-0 X1 27 (SUTURE) ×6 IMPLANT
SYSTEM SAHARA CHEST DRAIN ATS (WOUND CARE) ×3 IMPLANT
TAPE CLOTH SURG 4X10 WHT LF (GAUZE/BANDAGES/DRESSINGS) ×3 IMPLANT
TAPE CLOTH SURG 6X10 WHT LF (GAUZE/BANDAGES/DRESSINGS) ×3 IMPLANT
TAPE PAPER 2X10 WHT MICROPORE (GAUZE/BANDAGES/DRESSINGS) ×3 IMPLANT
TOWEL OR 17X24 6PK STRL BLUE (TOWEL DISPOSABLE) ×6 IMPLANT
TOWEL OR 17X26 10 PK STRL BLUE (TOWEL DISPOSABLE) ×6 IMPLANT
TRAY FOLEY IC TEMP SENS 16FR (CATHETERS) ×3 IMPLANT
TUBING INSUFFLATION (TUBING) ×3 IMPLANT
UNDERPAD 30X30 INCONTINENT (UNDERPADS AND DIAPERS) ×3 IMPLANT
WATER STERILE IRR 1000ML POUR (IV SOLUTION) ×6 IMPLANT

## 2015-09-17 NOTE — Anesthesia Procedure Notes (Addendum)
Procedure Name: Intubation Date/Time: 09/17/2015 8:55 AM Performed by: Tressia Miners LEFFEW Pre-anesthesia Checklist: Patient identified, Patient being monitored, Timeout performed, Emergency Drugs available and Suction available Patient Re-evaluated:Patient Re-evaluated prior to inductionOxygen Delivery Method: Circle System Utilized Preoxygenation: Pre-oxygenation with 100% oxygen Intubation Type: IV induction Ventilation: Mask ventilation without difficulty and Oral airway inserted - appropriate to patient size Laryngoscope Size: Mac and 3 Grade View: Grade II Tube type: Oral Tube size: 8.0 mm Number of attempts: 1 Airway Equipment and Method: Stylet Placement Confirmation: ETT inserted through vocal cords under direct vision,  positive ETCO2 and breath sounds checked- equal and bilateral Secured at: 23 cm Tube secured with: Tape Dental Injury: Teeth and Oropharynx as per pre-operative assessment

## 2015-09-17 NOTE — Anesthesia Postprocedure Evaluation (Signed)
Anesthesia Post Note  Patient: Timothy Gentry  Procedure(s) Performed: Procedure(s) (LRB): CORONARY ARTERY BYPASS GRAFTING (CABG) (N/A) TRANSESOPHAGEAL ECHOCARDIOGRAM (TEE) (N/A)  Patient location during evaluation: SICU Anesthesia Type: General Level of consciousness: sedated and patient remains intubated per anesthesia plan Pain management: pain level controlled Vital Signs Assessment: post-procedure vital signs reviewed and stable Respiratory status: respiratory function stable and patient remains intubated per anesthesia plan Cardiovascular status: blood pressure returned to baseline and stable Postop Assessment: no signs of nausea or vomiting Anesthetic complications: no    Last Vitals:  Filed Vitals:   09/17/15 1515 09/17/15 1530  BP: 100/70   Pulse: 69 80  Temp: 36.5 C 36.6 C  Resp: 17 22    Last Pain:  Filed Vitals:   09/17/15 1539  PainSc: 0-No pain                 Venice Liz,JAMES TERRILL

## 2015-09-17 NOTE — OR Nursing (Signed)
Retractor-out call at 1326.

## 2015-09-17 NOTE — OR Nursing (Signed)
Off-Pump call to SICU at 1258.

## 2015-09-17 NOTE — Progress Notes (Signed)
Patient ID: Timothy Gentry, male   DOB: 06/10/1956, 59 y.o.   MRN: [id number]   SICU Evening Rounds:   Hemodynamically stable  CI = 1.8 on low dose neo  Just extubated  Urine output good  CT output low  CBC    Component Value Date/Time   WBC 22.5* 09/17/2015 1455   RBC 4.03* 09/17/2015 1455   HGB 11.8* 09/17/2015 1455   HCT 34.4* 09/17/2015 1455   PLT 211 09/17/2015 1455   MCV 85.4 09/17/2015 1455   MCH 29.3 09/17/2015 1455   MCHC 34.3 09/17/2015 1455   RDW 15.6* 09/17/2015 1455   LYMPHSABS 3.5 09/15/2015 1800   MONOABS 1.3* 09/15/2015 1800   EOSABS 0.0 09/15/2015 1800   BASOSABS 0.0 09/15/2015 1800     BMET    Component Value Date/Time   NA 137 09/17/2015 1450   K 3.9 09/17/2015 1450   CL 99* 09/17/2015 1321   CO2 27 09/16/2015 1220   GLUCOSE 130* 09/17/2015 1450   BUN 13 09/17/2015 1321   CREATININE 0.80 09/17/2015 1321   CALCIUM 9.6 09/16/2015 1220   GFRNONAA >60 09/16/2015 1220   GFRAA >60 09/16/2015 1220     A/P:  Stable postop course. Continue current plans

## 2015-09-17 NOTE — Brief Op Note (Signed)
09/15/2015 - 09/17/2015  2:01 PM  PATIENT:  Timothy Gentry  59 y.o. male  PRE-OPERATIVE DIAGNOSIS:  CAD  POST-OPERATIVE DIAGNOSIS:  CAD  PROCEDURE:  Procedure(s): CORONARY ARTERY BYPASS GRAFTING (CABG) (N/A) TRANSESOPHAGEAL ECHOCARDIOGRAM (TEE) (N/A)  SURGEON:    Rexene Alberts, MD  ASSISTANTS:  Jadene Pierini, PA-C  ANESTHESIA:   Rica Koyanagi, MD  CROSSCLAMP TIME:   78'  CARDIOPULMONARY BYPASS TIME: 106'  FINDINGS:  Mild LV systolic dysfunction  Good quality LIMA conduit for grafting  Good quality SVG conduit for grafting  Good quality target vessels for grafting  COMPLICATIONS: None  BASELINE WEIGHT: 104 kg  PATIENT DISPOSITION:   TO SICU IN STABLE CONDITION  Rexene Alberts, MD 09/17/2015 2:01 PM

## 2015-09-17 NOTE — Anesthesia Preprocedure Evaluation (Addendum)
Anesthesia Evaluation  Patient identified by MRN, date of birth, ID band Patient awake    Reviewed: Allergy & Precautions, NPO status   Airway Mallampati: II  TM Distance: >3 FB Neck ROM: Full    Dental  (+) Teeth Intact   Pulmonary former smoker,    breath sounds clear to auscultation       Cardiovascular hypertension, + CAD and + Past MI   Rhythm:Regular Rate:Normal  Recent MI   Neuro/Psych    GI/Hepatic PUD,   Endo/Other  diabetes  Renal/GU Renal disease     Musculoskeletal   Abdominal (+) + obese,   Peds  Hematology   Anesthesia Other Findings   Reproductive/Obstetrics                            Anesthesia Physical Anesthesia Plan  ASA: III  Anesthesia Plan: General   Post-op Pain Management:    Induction: Intravenous  Airway Management Planned: Oral ETT  Additional Equipment: Arterial line, PA Cath and TEE  Intra-op Plan:   Post-operative Plan: Post-operative intubation/ventilation  Informed Consent: I have reviewed the patients History and Physical, chart, labs and discussed the procedure including the risks, benefits and alternatives for the proposed anesthesia with the patient or authorized representative who has indicated his/her understanding and acceptance.     Plan Discussed with: CRNA and Surgeon  Anesthesia Plan Comments:         Anesthesia Quick Evaluation

## 2015-09-17 NOTE — Brief Op Note (Signed)
09/15/2015 - 09/17/2015  12:32 PM  PATIENT:  Dianne Dun  59 y.o. male  PRE-OPERATIVE DIAGNOSIS:  CAD  POST-OPERATIVE DIAGNOSIS:  CAD  PROCEDURE:  Procedure(s): CORONARY ARTERY BYPASS GRAFTING (CABG) (N/A) TRANSESOPHAGEAL ECHOCARDIOGRAM (TEE) (N/A) LIMA-LAD; SVG-DIST RCA; SVG-DIAG1; SVG-DIAG2;  ENDOSCOPIC GREATER SAPHENOUS VEIN HARVEST(RIGHT)  SURGEON:  Surgeon(s) and Role:    * Rexene Alberts, MD - Primary  PHYSICIAN ASSISTANT: WAYNE GOLD PA-C  ANESTHESIA:   general  EBL:  Total I/O In: 1500 [I.V.:1500] Out: 350 [Urine:350]  BLOOD ADMINISTERED:none

## 2015-09-17 NOTE — Transfer of Care (Signed)
Immediate Anesthesia Transfer of Care Note  Patient: DONTRELL PULLAR  Procedure(s) Performed: Procedure(s): CORONARY ARTERY BYPASS GRAFTING (CABG) (N/A) TRANSESOPHAGEAL ECHOCARDIOGRAM (TEE) (N/A)  Patient Location: SICU  Anesthesia Type:General  Level of Consciousness: Patient remains intubated per anesthesia plan  Airway & Oxygen Therapy: Patient remains intubated per anesthesia plan and Patient placed on Ventilator (see vital sign flow sheet for setting)  Post-op Assessment: Report given to RN and Post -op Vital signs reviewed and stable  Post vital signs: Reviewed and stable  Last Vitals:  Filed Vitals:   09/17/15 0600 09/17/15 0700  BP: 99/67 102/73  Pulse: 65 69  Temp:    Resp: 20 16    Complications: No apparent anesthesia complications

## 2015-09-17 NOTE — Op Note (Signed)
CARDIOTHORACIC SURGERY OPERATIVE NOTE  Date of Procedure: 09/17/2015  Preoperative Diagnosis:   Severe 3-vessel Coronary Artery Disease  S/P Acute Non-ST Elevation Myocardial Infarction  Postoperative Diagnosis: Same  Procedure:    Coronary Artery Bypass Grafting x 4   Left Internal Mammary Artery to Distal Left Anterior Descending Coronary Artery  Saphenous Vein Graft to Distal Right Coronary Artery  Saphenous Vein Graft to First Diagonal Branch Coronary Artery  Sapheonous Vein Graft to Second Diagonal Branch Coronary Artery  Endoscopic Vein Harvest from Right Thigh and Lower Leg  Surgeon: Valentina Gu. Roxy Manns, MD  Assistant: John Giovanni, PA-C  Anesthesia: Rica Koyanagi, MD  Operative Findings:  Mild LV systolic dysfunction  Good quality LIMA conduit for grafting  Good quality SVG conduit for grafting  Good quality target vessels for grafting  No suitable target vessel for grafting in distal left circumflex coronary artery territory    BRIEF CLINICAL NOTE AND INDICATIONS FOR SURGERY  Patient is a 59 year old male with no previous history of coronary artery disease but multiple risk factors including history of hypertension, type 2 diabetes mellitus, hyperlipidemia, and remote tobacco use who presents with a 2 day history of chest discomfort and positive cardiac enzymes consistent with acute non-ST segment elevation myocardial infarction. Diagnostic cardiac catheterization reveals severe three-vessel coronary artery disease with likely recent 100% acute occlusion of the right coronary artery. There is mild left ventricular dysfunction.  The patient has been seen in consultation and counseled at length regarding the indications, risks and potential benefits of surgery.  All questions have been answered, and the patient provides full informed consent for the operation as described.    DETAILS OF THE OPERATIVE PROCEDURE  Preparation:  The patient is brought to the  operating room on the above mentioned date and central monitoring was established by the anesthesia team including placement of Swan-Ganz catheter and radial arterial line. The patient is placed in the supine position on the operating table.  Intravenous antibiotics are administered. General endotracheal anesthesia is induced uneventfully. A Foley catheter is placed.  Baseline transesophageal echocardiogram was performed.  Findings were notable for mild LV dysfunction.  The patient's chest, abdomen, both groins, and both lower extremities are prepared and draped in a sterile manner. A time out procedure is performed.   Surgical Approach and Conduit Harvest:  A median sternotomy incision was performed and the left internal mammary artery is dissected from the chest wall and prepared for bypass grafting. The left internal mammary artery is notably good quality conduit. Simultaneously, the greater saphenous vein is obtained from the patient's right thigh and lower leg using endoscopic vein harvest technique. The saphenous vein is notably good quality conduit. After removal of the saphenous vein, the small surgical incisions in the lower extremity are closed with absorbable suture. Following systemic heparinization, the left internal mammary artery was transected distally noted to have excellent flow.   Extracorporeal Cardiopulmonary Bypass and Myocardial Protection:  The pericardium is opened. The ascending aorta is normal in appearance. The ascending aorta and the right atrium are cannulated for cardiopulmonary bypass.  Adequate heparinization is verified.    A retrograde cardioplegia cannula is placed through the right atrium into the coronary sinus.  The entire pre-bypass portion of the operation was notable for stable hemodynamics.  Cardiopulmonary bypass was begun and the surface of the heart is inspected. Distal target vessels are selected for coronary artery bypass grafting.  A cardioplegia  cannula is placed in the ascending aorta.  A temperature probe  was placed in the interventricular septum.  The patient is allowed to cool passively to 34C systemic temperature.  The aortic cross clamp is applied and cold blood cardioplegia is delivered initially in an antegrade fashion through the aortic root.   Supplemental cardioplegia is given retrograde through the coronary sinus catheter.  Iced saline slush is applied for topical hypothermia.  The initial cardioplegic arrest is rapid with early diastolic arrest.  Repeat doses of cardioplegia are administered intermittently throughout the entire cross clamp portion of the operation through the aortic root, through the coronary sinus catheter, and through subsequently placed vein grafts in order to maintain completely flat electrocardiogram and septal myocardial temperature below 15C.  Myocardial protection was felt to be excellent.  Coronary Artery Bypass Grafting:   The distal right coronary artery was grafted using a reversed saphenous vein graft in an end-to-side fashion.  At the site of distal anastomosis the target vessel was good quality and measured approximately 2.0 mm in diameter.  The AV groove left circumflex and its terminal branches are explored.  There is no suitable distal target vessel in this distribution.  The terminal branches of the left circumflex are too small for grafting.    The first diagonal branch of the left anterior descending coronary artery was grafted using a reversed saphenous vein graft in an end-to-side fashion.  At the site of distal anastomosis the target vessel was good quality and measured approximately 1.4 mm in diameter.  The second diagonal branch of the left anterior descending coronary artery was grafted using a reversed saphenous vein graft in an end-to-side fashion.  At the site of distal anastomosis the target vessel was good quality and measured approximately 1.6 mm in diameter.  The distal left  anterior coronary artery was grafted with the left internal mammary artery in an end-to-side fashion.  At the site of distal anastomosis the target vessel was good quality and measured approximately 2.0 mm in diameter.  All proximal vein graft anastomoses were placed directly to the ascending aorta prior to removal of the aortic cross clamp.  The septal myocardial temperature rose rapidly after reperfusion of the left internal mammary artery graft.  The aortic cross clamp was removed after a total cross clamp time of 84 minutes.   Procedure Completion:  All proximal and distal coronary anastomoses were inspected for hemostasis and appropriate graft orientation. Epicardial pacing wires are fixed to the right ventricular outflow tract and to the right atrial appendage. The patient is rewarmed to 37C temperature. The patient is weaned and disconnected from cardiopulmonary bypass.  The patient's rhythm at separation from bypass was sinus.  The patient was weaned from cardiopulmonary bypass without any inotropic support. Total cardiopulmonary bypass time for the operation was 106 minutes.  Followup transesophageal echocardiogram performed after separation from bypass revealed no changes from the preoperative exam.  The aortic and venous cannula were removed uneventfully. Protamine was administered to reverse the anticoagulation. The mediastinum and pleural space were inspected for hemostasis and irrigated with saline solution. The mediastinum and the left pleural space were drained using 3 chest tubes placed through separate stab incisions inferiorly.  The soft tissues anterior to the aorta were reapproximated loosely. The sternum is closed with double strength sternal wire. The soft tissues anterior to the sternum were closed in multiple layers and the skin is closed with a running subcuticular skin closure.  The post-bypass portion of the operation was notable for stable rhythm and hemodynamics.  No  blood products were  administered during the operation.   Disposition:  The patient tolerated the procedure well and is transported to the surgical intensive care in stable condition. There are no intraoperative complications. All sponge instrument and needle counts are verified correct at completion of the operation.    Valentina Gu. Roxy Manns MD 09/17/2015 2:06 PM

## 2015-09-17 NOTE — Progress Notes (Signed)
  Echocardiogram Echocardiogram Transesophageal has been performed.  [name] 09/17/2015, 9:31 AM

## 2015-09-17 NOTE — Procedures (Signed)
Extubation Procedure Note  Patient Details:   Name: Timothy Gentry DOB: March 29, 1956 MRN: [id number]   Airway Documentation:     Evaluation  O2 sats: stable throughout Complications: No apparent complications Patient did tolerate procedure well. Bilateral Breath Sounds: Clear, Diminished Suctioning: Airway, Oral Yes  4l/min [place] IS instructed 623ml  Revonda Standard 09/17/2015, 6:15 PM

## 2015-09-17 NOTE — OR Nursing (Signed)
Skin closure call to SICU at 1400.

## 2015-09-18 ENCOUNTER — Encounter (HOSPITAL_COMMUNITY): Payer: Self-pay | Admitting: Thoracic Surgery (Cardiothoracic Vascular Surgery)

## 2015-09-18 ENCOUNTER — Inpatient Hospital Stay (HOSPITAL_COMMUNITY): Payer: Managed Care, Other (non HMO)

## 2015-09-18 DIAGNOSIS — Z951 Presence of aortocoronary bypass graft: Secondary | ICD-10-CM

## 2015-09-18 LAB — GLUCOSE, CAPILLARY
GLUCOSE-CAPILLARY: 103 mg/dL — AB (ref 65–99)
GLUCOSE-CAPILLARY: 104 mg/dL — AB (ref 65–99)
GLUCOSE-CAPILLARY: 107 mg/dL — AB (ref 65–99)
GLUCOSE-CAPILLARY: 110 mg/dL — AB (ref 65–99)
GLUCOSE-CAPILLARY: 112 mg/dL — AB (ref 65–99)
GLUCOSE-CAPILLARY: 115 mg/dL — AB (ref 65–99)
GLUCOSE-CAPILLARY: 117 mg/dL — AB (ref 65–99)
GLUCOSE-CAPILLARY: 118 mg/dL — AB (ref 65–99)
GLUCOSE-CAPILLARY: 88 mg/dL (ref 65–99)
GLUCOSE-CAPILLARY: 89 mg/dL (ref 65–99)
GLUCOSE-CAPILLARY: 95 mg/dL (ref 65–99)
GLUCOSE-CAPILLARY: 97 mg/dL (ref 65–99)
GLUCOSE-CAPILLARY: 99 mg/dL (ref 65–99)
Glucose-Capillary: 98 mg/dL (ref 65–99)
Glucose-Capillary: 111 mg/dL — ABNORMAL HIGH (ref 65–99)
Glucose-Capillary: 126 mg/dL — ABNORMAL HIGH (ref 65–99)
Glucose-Capillary: 112 mg/dL — ABNORMAL HIGH (ref 65–99)
Glucose-Capillary: 110 mg/dL — ABNORMAL HIGH (ref 65–99)
Glucose-Capillary: 91 mg/dL (ref 65–99)
Glucose-Capillary: 103 mg/dL — ABNORMAL HIGH (ref 65–99)
Glucose-Capillary: 137 mg/dL — ABNORMAL HIGH (ref 65–99)
Glucose-Capillary: 122 mg/dL — ABNORMAL HIGH (ref 65–99)
Glucose-Capillary: 135 mg/dL — ABNORMAL HIGH (ref 65–99)
Glucose-Capillary: 122 mg/dL — ABNORMAL HIGH (ref 65–99)

## 2015-09-18 LAB — POCT I-STAT, CHEM 8
BUN: 24 mg/dL — ABNORMAL HIGH (ref 6–20)
Calcium, Ion: 1.18 mmol/L (ref 1.12–1.23)
Chloride: 100 mmol/L — ABNORMAL LOW (ref 101–111)
Creatinine, Ser: 1.3 mg/dL — ABNORMAL HIGH (ref 0.61–1.24)
Glucose, Bld: 154 mg/dL — ABNORMAL HIGH (ref 65–99)
HCT: 40 % (ref 39.0–52.0)
Hemoglobin: 13.6 g/dL (ref 13.0–17.0)
Potassium: 4 mmol/L (ref 3.5–5.1)
Sodium: 135 mmol/L (ref 135–145)
TCO2: 23 mmol/L (ref 0–100)

## 2015-09-18 LAB — CBC
HCT: 34.9 % — ABNORMAL LOW (ref 39.0–52.0)
HCT: 36.8 % — ABNORMAL LOW (ref 39.0–52.0)
Hemoglobin: 11.4 g/dL — ABNORMAL LOW (ref 13.0–17.0)
Hemoglobin: 12 g/dL — ABNORMAL LOW (ref 13.0–17.0)
MCH: 28.1 pg (ref 26.0–34.0)
MCH: 28.3 pg (ref 26.0–34.0)
MCHC: 32.7 g/dL (ref 30.0–36.0)
MCHC: 32.6 g/dL (ref 30.0–36.0)
MCV: 86 fL (ref 78.0–100.0)
MCV: 86.8 fL (ref 78.0–100.0)
PLATELETS: 244 10*3/uL (ref 150–400)
PLATELETS: 262 10*3/uL (ref 150–400)
RBC: 4.06 MIL/uL — ABNORMAL LOW (ref 4.22–5.81)
RBC: 4.24 MIL/uL (ref 4.22–5.81)
RDW: 15.6 % — AB (ref 11.5–15.5)
RDW: 15.7 % — AB (ref 11.5–15.5)
WBC: 20.1 10*3/uL — AB (ref 4.0–10.5)
WBC: 20.2 10*3/uL — ABNORMAL HIGH (ref 4.0–10.5)

## 2015-09-18 LAB — CREATININE, SERUM
CREATININE: 1.41 mg/dL — AB (ref 0.61–1.24)
GFR calc Af Amer: >60 mL/min (ref >60)
GFR calc non Af Amer: 53 mL/min — ABNORMAL LOW (ref >60)

## 2015-09-18 LAB — POCT I-STAT 3, ART BLOOD GAS (G3+)
Acid-base deficit: 3 mmol/L — ABNORMAL HIGH (ref 0.0–2.0)
Bicarbonate: 21.4 mEq/L (ref 20.0–24.0)
O2 Saturation: 99 %
PCO2 ART: 35.1 mmHg (ref 35.0–45.0)
PH ART: 7.396 (ref 7.350–7.450)
Patient temperature: 37.7
TCO2: 22 mmol/L (ref 0–100)
pO2, Arterial: 119 mmHg — ABNORMAL HIGH (ref 80.0–100.0)

## 2015-09-18 LAB — POCT I-STAT 3, VENOUS BLOOD GAS (G3P V)
ACID-BASE DEFICIT: 3 mmol/L — AB (ref 0.0–2.0)
BICARBONATE: 22.2 meq/L (ref 20.0–24.0)
O2 Saturation: 53 %
PCO2 VEN: 38.7 mmHg — AB (ref 45.0–50.0)
PH VEN: 7.368 — AB (ref 7.250–7.300)
PO2 VEN: 29 mmHg — AB (ref 30.0–45.0)
TCO2: 23 mmol/L (ref 0–100)

## 2015-09-18 LAB — BASIC METABOLIC PANEL
ANION GAP: 8 (ref 5–15)
BUN: 14 mg/dL (ref 6–20)
CHLORIDE: 106 mmol/L (ref 101–111)
CO2: 21 mmol/L — ABNORMAL LOW (ref 22–32)
Calcium: 8 mg/dL — ABNORMAL LOW (ref 8.9–10.3)
Creatinine, Ser: 1.13 mg/dL (ref 0.61–1.24)
GLUCOSE: 108 mg/dL — AB (ref 65–99)
Potassium: 4.1 mmol/L (ref 3.5–5.1)
Sodium: 135 mmol/L (ref 135–145)

## 2015-09-18 LAB — MAGNESIUM
MAGNESIUM: 2.5 mg/dL — AB (ref 1.7–2.4)
Magnesium: 2.5 mg/dL — ABNORMAL HIGH (ref 1.7–2.4)

## 2015-09-18 MED ORDER — ALLOPURINOL 300 MG PO TABS: 150.0000 mg | ORAL_TABLET | Freq: Two times a day (BID) | ORAL | Status: DC

## 2015-09-18 MED ORDER — ALLOPURINOL 300 MG PO TABS
300.0000 mg | ORAL_TABLET | Freq: Every day | ORAL | Status: DC
  Administered 2015-09-18 – 2015-09-20 (×3): 300 mg via ORAL
  Filled 2015-09-18 (×3): qty 1

## 2015-09-18 MED ORDER — INSULIN ASPART 100 UNIT/ML ~~LOC~~ SOLN
0.0000 [IU] | SUBCUTANEOUS | Status: DC
  Administered 2015-09-18 – 2015-09-19 (×2): 2 [IU] via SUBCUTANEOUS

## 2015-09-18 MED ORDER — KETOROLAC TROMETHAMINE 15 MG/ML IJ SOLN
15.0000 mg | Freq: Once | INTRAMUSCULAR | Status: AC
  Administered 2015-09-18: 15 mg via INTRAVENOUS
  Filled 2015-09-18: qty 1

## 2015-09-18 MED ORDER — MORPHINE SULFATE (PF) 2 MG/ML IV SOLN
2.0000 mg | INTRAVENOUS | Status: DC | PRN
  Administered 2015-09-18 – 2015-09-19 (×3): 2 mg via INTRAVENOUS
  Filled 2015-09-18 (×3): qty 1

## 2015-09-18 MED ORDER — ALLOPURINOL 300 MG PO TABS
150.0000 mg | ORAL_TABLET | Freq: Every day | ORAL | Status: DC
  Administered 2015-09-18 – 2015-09-21 (×4): 150 mg via ORAL
  Filled 2015-09-18 (×4): qty 1

## 2015-09-18 MED ORDER — PRAVASTATIN SODIUM 40 MG PO TABS
80.0000 mg | ORAL_TABLET | Freq: Every evening | ORAL | Status: DC
  Administered 2015-09-18 – 2015-09-20 (×3): 80 mg via ORAL
  Filled 2015-09-18 (×3): qty 2

## 2015-09-18 NOTE — Progress Notes (Signed)
[address]       [place] [address]             [phone]        CARDIOTHORACIC SURGERY PROGRESS NOTE   R1 Day Post-Op Procedure(s) (LRB): CORONARY ARTERY BYPASS GRAFTING (CABG) (N/A) TRANSESOPHAGEAL ECHOCARDIOGRAM (TEE) (N/A)  Subjective: Looks good.  Mild soreness in chest.  Doing well.  Objective: Vital signs: BP Readings from Last 1 Encounters:  09/18/15 101/63   Pulse Readings from Last 1 Encounters:  09/18/15 80   Resp Readings from Last 1 Encounters:  09/18/15 16   Temp Readings from Last 1 Encounters:  09/18/15 99.1 F (37.3 C) Core (Comment)    Hemodynamics: PAP: (25-48)/(14-28) 32/17 mmHg CO:  [3.1 L/min-4.6 L/min] 4.1 L/min CI:  [1.4 L/min/m2-2.1 L/min/m2] 1.9 L/min/m2  Physical Exam:  Rhythm:   sinus  Breath sounds: clear  Heart sounds:  RRR  Incisions:  Dressing dry, intact  Abdomen:  Soft, non-distended, non-tender  Extremities:  Warm, well-perfused  Chest tubes:  Low volume thin serosanguinous output, no air leak    Intake/Output from previous day: 12/07 0701 - 12/08 0700 In: 4721.5 [I.V.:3496.5; Blood:375; IV Piggyback:850] Out: 2615 [Urine:2245; Chest Tube:370] Intake/Output this shift: Total I/O In: 40.5 [I.V.:40.5] Out: 65 [Urine:45; Chest Tube:20]  Lab Results:  CBC: Recent Labs  09/17/15 2008 09/18/15 0409  WBC 19.3* 20.1*  HGB 12.2* 11.4*  HCT 36.3* 34.9*  PLT 239 244    BMET:  Recent Labs  09/16/15 1220  09/17/15 2001 09/17/15 2008 09/18/15 0409  NA 139  < > 138  --  135  K 4.2  < > 4.7  --  4.1  CL 102  < > 107  --  106  CO2 27  --   --   --  21*  GLUCOSE 113*  < > 123*  --  108*  BUN 10  < > 15  --  14  CREATININE 1.20  < > 1.00 1.11 1.13  CALCIUM 9.6  --   --   --  8.0*  < > = values in this interval not displayed.   PT/INR:   Recent Labs  09/17/15 1455  LABPROT 17.5*  INR 1.43    CBG (last 3)   Recent Labs  09/18/15 0308 09/18/15 0400 09/18/15 0500  GLUCAP 97 95  91    ABG    Component Value Date/Time   PHART 7.396 09/18/2015 0637   PCO2ART 35.1 09/18/2015 0637   PO2ART 119.0* 09/18/2015 0637   HCO3 21.4 09/18/2015 0637   TCO2 22 09/18/2015 0637   ACIDBASEDEF 3.0* 09/18/2015 0637   O2SAT 99.0 09/18/2015 0637    CXR: PORTABLE CHEST 1 VIEW  COMPARISON: 09/17/2015 and earlier.  FINDINGS: Extubated. Enteric tube removed. Left chest tube and mediastinal drain remain in place. Right IJ approach Swan-Ganz catheter remains in place. As before, this appears somewhat looped in the left main pulmonary artery region.  Mildly lower lung volumes. Increased patchy opacity at the left base, linear atelectasis in the left mid lung. No pneumothorax or pulmonary edema. Probable small left effusion. Stable cardiac size and mediastinal contours. Sequelae of CABG.  IMPRESSION: 1. Extubated and enteric tube removed. Mildly lower lung volumes and increased left lung atelectasis. 2. Otherwise, stable lines and tubes. 3. No pneumothorax or edema. Small left effusion.   Electronically Signed  By: Genevie Ann M.D.  On: 09/18/2015 07:30   EKG: NSR w/out acute ischemic changes, RBBB  and changes c/w recent inferior MI    Assessment/Plan: S/P Procedure(s) (LRB): CORONARY ARTERY BYPASS GRAFTING (CABG) (N/A) TRANSESOPHAGEAL ECHOCARDIOGRAM (TEE) (N/A)  Doing well POD1 Maintaining NSR w/ stable hemodynamics, still on low dose Neo for BP S/P Acute Inferior MI, mild LV dysfunction Expected post op acute blood loss anemia, mild, stable Expected post op atelectasis, mild Expected post op volume excess, mild Leukocytosis, likely reactive and secondary to surgery and acute MI Hypertension Hyperlipidemia Type II diabetes mellitus, excellent glycemic control   Mobilize  D/C tubes and lines  Wean Neo as tolerated  Hold diuretics until BP improved  Keep in SICU until stable off Neo   Rexene Alberts, MD 09/18/2015 9:42 AM

## 2015-09-19 ENCOUNTER — Inpatient Hospital Stay (HOSPITAL_COMMUNITY): Payer: Managed Care, Other (non HMO)

## 2015-09-19 LAB — CBC
HEMATOCRIT: 32.5 % — AB (ref 39.0–52.0)
HEMOGLOBIN: 10.9 g/dL — AB (ref 13.0–17.0)
MCH: 28.9 pg (ref 26.0–34.0)
MCHC: 33.5 g/dL (ref 30.0–36.0)
MCV: 86.2 fL (ref 78.0–100.0)
Platelets: 256 10*3/uL (ref 150–400)
RBC: 3.77 MIL/uL — ABNORMAL LOW (ref 4.22–5.81)
RDW: 15.5 % (ref 11.5–15.5)
WBC: 17.7 10*3/uL — ABNORMAL HIGH (ref 4.0–10.5)

## 2015-09-19 LAB — BASIC METABOLIC PANEL
Anion gap: 8 (ref 5–15)
BUN: 18 mg/dL (ref 6–20)
CO2: 22 mmol/L (ref 22–32)
Calcium: 7.8 mg/dL — ABNORMAL LOW (ref 8.9–10.3)
Chloride: 104 mmol/L (ref 101–111)
Creatinine, Ser: 1.13 mg/dL (ref 0.61–1.24)
GFR calc Af Amer: >60 mL/min (ref >60)
GLUCOSE: 125 mg/dL — AB (ref 65–99)
POTASSIUM: 3.6 mmol/L (ref 3.5–5.1)
Sodium: 134 mmol/L — ABNORMAL LOW (ref 135–145)

## 2015-09-19 LAB — GLUCOSE, CAPILLARY
GLUCOSE-CAPILLARY: 122 mg/dL — AB (ref 65–99)
GLUCOSE-CAPILLARY: 119 mg/dL — AB (ref 65–99)
Glucose-Capillary: 102 mg/dL — ABNORMAL HIGH (ref 65–99)
Glucose-Capillary: 105 mg/dL — ABNORMAL HIGH (ref 65–99)
Glucose-Capillary: 146 mg/dL — ABNORMAL HIGH (ref 65–99)
Glucose-Capillary: 125 mg/dL — ABNORMAL HIGH (ref 65–99)

## 2015-09-19 MED ORDER — SODIUM CHLORIDE 0.9 % IJ SOLN: 3.0000 mL | INTRAMUSCULAR | Status: DC | PRN

## 2015-09-19 MED ORDER — INSULIN ASPART 100 UNIT/ML ~~LOC~~ SOLN
0.0000 [IU] | Freq: Three times a day (TID) | SUBCUTANEOUS | Status: DC
  Administered 2015-09-19 – 2015-09-20 (×2): 2 [IU] via SUBCUTANEOUS

## 2015-09-19 MED ORDER — POTASSIUM CHLORIDE 10 MEQ/50ML IV SOLN
10.0000 meq | INTRAVENOUS | Status: AC
  Administered 2015-09-19 (×3): 10 meq via INTRAVENOUS
  Filled 2015-09-19 (×3): qty 50

## 2015-09-19 MED ORDER — SODIUM CHLORIDE 0.9 % IJ SOLN
3.0000 mL | Freq: Two times a day (BID) | INTRAMUSCULAR | Status: DC
  Administered 2015-09-19 (×2): 3 mL via INTRAVENOUS

## 2015-09-19 MED ORDER — FUROSEMIDE 40 MG PO TABS
40.0000 mg | ORAL_TABLET | Freq: Every day | ORAL | Status: AC
Start: 2015-09-20 — End: 2015-09-21
  Administered 2015-09-20 – 2015-09-21 (×2): 40 mg via ORAL
  Filled 2015-09-19 (×2): qty 1

## 2015-09-19 MED ORDER — ENOXAPARIN SODIUM 30 MG/0.3ML ~~LOC~~ SOLN
30.0000 mg | SUBCUTANEOUS | Status: DC
  Administered 2015-09-19 – 2015-09-20 (×2): 30 mg via SUBCUTANEOUS
  Filled 2015-09-19 (×2): qty 0.3

## 2015-09-19 MED ORDER — POTASSIUM CHLORIDE CRYS ER 20 MEQ PO TBCR: 20.0000 meq | EXTENDED_RELEASE_TABLET | Freq: Every day | ORAL | Status: DC

## 2015-09-19 MED ORDER — MOVING RIGHT ALONG BOOK
Freq: Once | Status: AC
  Administered 2015-09-19: 11:00:00
  Filled 2015-09-19: qty 1

## 2015-09-19 MED ORDER — SODIUM CHLORIDE 0.9 % IV SOLN: 250.0000 mL | INTRAVENOUS | Status: DC | PRN

## 2015-09-19 MED ORDER — FUROSEMIDE 10 MG/ML IJ SOLN
20.0000 mg | Freq: Two times a day (BID) | INTRAMUSCULAR | Status: AC
  Administered 2015-09-19 (×2): 20 mg via INTRAVENOUS
  Filled 2015-09-19 (×2): qty 2

## 2015-09-19 NOTE — Discharge Summary (Signed)
Physician Discharge Summary  Patient ID: Timothy Gentry MRN: [id number] DOB/AGE: 1956-01-01 59 y.o.  Admit date: 09/15/2015 Discharge date: 09/19/2015  Admission Diagnoses: NSTEMI  Discharge Diagnoses:  Principal Problem:   S/P CABG x 4 Active Problems:   HTN (hypertension)   Hyperlipemia   NSTEMI (non-ST elevated myocardial infarction) ([place])   Diabetes mellitus type 2 in obese [facility])   Coronary artery disease  Patient Active Problem List   Diagnosis Date Noted  . S/P CABG x 4 09/17/2015  . NSTEMI (non-ST elevated myocardial infarction) ([place]) 09/15/2015  . Diabetes mellitus type 2 in obese ([place]) 09/15/2015  . Coronary artery disease 09/15/2015  . Rectal bleeding 09/22/2014  . Bright red blood per rectum 09/22/2014  . Syncope 09/22/2014  . HTN (hypertension) 09/22/2014  . Hyperlipemia 09/22/2014  . Benign neoplasm of pancreas 03/29/2014    History of Present Illness: At time of consultation Timothy Gentry is a 59 year old Caucasian male with a past medical history of hypertension, diabetes, hyperlipidemia, and remote tobacco abuse who presented to [facility] on 09/15/2015 with complaints of tooth/jaw pain. He also had complaints of chest heaviness/discomfort intermittently over the same time period. He states he did yard work and washed cars on Saturday. Afterward, he had tooth pain, some shortness of breath, and chest discomfort. On Sunday, he was more short of breath with exertion and very tired All of these symptoms began abruptly. He has been followed regularly for hypertension, hyperlipidemia, and type 2 diabetes. He states that all of these chronic problems of been well controlled on medical therapy.  After arrival in the emergency department yesterday evening, the patient was given sublingual nitroglycerin with improvement in his tooth/jaw pain. He then stated his breathing was improved.   EKG was not diagnostic for a STEMI but there were dynamic changed with improvement  in ST elevation after the administration of NTG. Initial Troponin I was 6.61 and increased to 7.22. He ruled in for a NSTEMI. He underwent an emergent cardiac catheterization by Dr. Burt Knack on 09/15/2015. Results showed severe 3 vessel coronary artery disease (RCA 100% stenosed, proximal Circumflex 90% stenosed, proximal and mid LAD 75% stenosed, mid LAD 90% stenosed, and distal LAD 95% stenosed, and LVEF 45%.  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 He denies any bleeding problems.   TCTS was consulted for consideration of coronary artery bypass grating surgery. He currently has no chest pain, is on a heparin drip, and vital signs are stable.  Discharged Condition: good  Hospital Course: The patient was admitted and stabilized medically. Cardiac catheterization results are as described. Cardiothoracic surgical consultation was obtained with Darylene Price M.D. who evaluated the patient and his studies and agree with recommendations to proceed with coronary artery surgical revascularization. The procedures described below. He tolerated it well and was taken to the surgical intensive care unit in stable condition. Postoperatively he has progressed very nicely. He is maintained stable hemodynamics. He was weaned from the ventilator without difficulty using standard protocols. He is maintained normal sinus rhythm. All routine lines, monitors and drainage devices have been discontinued in the standard fashion. He has a mild expected acute blood loss anemia which is stable. He has a postoperative leukocytosis which is trending towards normal any evidences no signs of infection or fevers. Incisions are noted to be healing well. Oxygen has been weaned and he maintains good saturations on room air. He is tolerating ambulation without difficulty. His overall status is felt to be  quite stable for discharge on today's date. He will require some diuresis as an outpatient as he is maintaining some  volume which is improving. His blood pressure is good but has not allowed resumption of his ARB as it is in the range of 100-120 typically and sometimes slightly lower. This will probably be able to be restarted as an outpatient.  Consults: cardiology and cardiothoracic surgery  Procedures:  Left Heart Cath and Coronary Angiography    Conclusion     Prox RCA to Mid RCA lesion, 99% stenosed.  Mid RCA lesion, 100% stenosed.  Prox Cx lesion, 90% stenosed.  Prox LAD to Mid LAD lesion, 75% stenosed.  Mid LAD lesion, 90% stenosed.  Dist LAD lesion, 95% stenosed.  There is mild left ventricular systolic dysfunction.  1. Severe 3 vessel obstructive CAD  -diffuse calcific 75% disease in the mid LAD. 90% stenosis in the mid LAD immediately after the second diagonal. 95% in the mid LAD  - 90% proximal LCx at the takeoff of the first OM which then bifurcates.  - 100% mid RCA with collaterals.  2. Mild LV dysfunction with EF 45%  Plan: Recommend CABG. Patient is currently pain free. Will transfer to unit for medical therapy. Dr. Servando Snare consulted.      Date of Procedure:09/17/2015  Preoperative Diagnosis:  Severe 3-vessel Coronary Artery Disease  S/P Acute Non-ST Elevation Myocardial Infarction  Postoperative Diagnosis:Same  Procedure:   Coronary Artery Bypass Grafting x 4 Left Internal Mammary Artery to Distal Left Anterior Descending Coronary Artery Saphenous Vein Graft to Distal Right Coronary Artery Saphenous Vein Graft to First Diagonal Branch Coronary Artery Sapheonous Vein Graft to Second Diagonal Branch Coronary Artery Endoscopic Vein Harvest from Right Thigh and Lower Leg  Surgeon:Clarence H. Roxy Manns, MD  Assistant:Addie Alonge Orson Ape, PA-C  Anesthesia:Terry Massagee, MD  Operative Findings:  Mild LV systolic dysfunction  Good quality LIMA conduit for  grafting  Good quality SVG conduit for grafting  Good quality target vessels for grafting  No suitable target vessel for grafting in distal left circumflex coronary artery territory   Diagnostics: cardiac catheterizatiion   Routine post-op labs/radiography   Discharge Exam: Blood pressure 110/66, pulse 84, temperature 98.3 F (36.8 C), temperature source Axillary, resp. rate 22, height 5\' 10"  (1.778 m), weight 241 lb 6.5 oz (109.5 kg), SpO2 99 %.   General appearance: alert, cooperative and no distress Heart: regular rate and rhythm Lungs: clear to auscultation bilaterally Abdomen: benign Extremities: + LE edema Wound: incis healing well Disposition: 01-Home or Self Care  Discharge Instructions    AMB Referral to Cardiac Rehabilitation - Phase II    Complete by:  As directed   Diagnosis:  CABG            Medication List    STOP taking these medications        aspirin 81 MG tablet  Replaced by:  aspirin 325 MG EC tablet     hydrochlorothiazide 12.5 MG tablet  Commonly known as:  HYDRODIURIL     losartan 100 MG tablet  Commonly known as:  COZAAR      TAKE these medications        allopurinol 300 MG tablet  Commonly known as:  ZYLOPRIM  Take 150-300 mg by mouth 2 (two) times daily. 150 mg in the morning and 300 mg in the evening.     aspirin 325 MG EC tablet  Take 1 tablet (325 mg total) by mouth daily.     furosemide  40 MG tablet  Commonly known as:  LASIX  Take 1 tablet (40 mg total) by mouth daily.     metFORMIN 500 MG 24 hr tablet  Commonly known as:  GLUCOPHAGE-XR  Take 500 mg by mouth 2 (two) times daily.     metoprolol tartrate 25 MG tablet  Commonly known as:  LOPRESSOR  Take 0.5 tablets (12.5 mg total) by mouth 2 (two) times daily.     multivitamin tablet  Take 1 tablet by mouth daily.     oxyCODONE 5 MG immediate release tablet  Commonly known as:  Oxy IR/ROXICODONE  Take 1-2 tablets (5-10 mg total) by mouth every 4 (four) hours as  needed for severe pain.     potassium chloride SA 20 MEQ tablet  Commonly known as:  K-DUR,KLOR-CON  Take 1 tablet (20 mEq total) by mouth daily.     pravastatin 80 MG tablet  Commonly known as:  PRAVACHOL  Take 80 mg by mouth every evening.       Follow-up Information    Follow up with Rexene Alberts, MD.   Specialty:  Cardiothoracic Surgery   Why:  10/20/15 at 1 PM to see the surgeon. Please obtain a chest x-ray [facility] at 12:30 PM. [facility] is located in the same office complex as the Psychologist, sport and exercise.   Contact information:   [address] [place] [address] [phone]       Follow up with Isaiah Serge, NP On 10/08/2015.   Specialties:  Cardiology, Radiology   Why:  @ 3:30pm to follow with general cardiology after  your open heart surgery   Contact information:   [place] [address] [phone]      The patient has been discharged on:   1.Beta Blocker:  Yes [ y  ]                              No   [   ]                              If No, reason:  2.Ace Inhibitor/ARB: Yes [   ]                                     No  [  n  ]                                     If No, reason:low BP  3.Statin:   Yes [ y  ]                  No  [   ]                  If No, reason:  4.Ecasa:  Yes  [ y  ]                  No   [   ]                  If No, reason:  Signed: Aryia Delira E 09/19/2015, 11:09 AM

## 2015-09-19 NOTE — Progress Notes (Signed)
Subjective: Feeling and breathing better than yesterday  Objective: Vital signs in last 24 hours: Temp:  [97.6 F (36.4 C)-98.8 F (37.1 C)] 98.3 F (36.8 C) (12/09 0740) Pulse Rate:  [86-96] 96 (12/09 0859) Resp:  [10-27] 21 (12/09 0800) BP: (83-115)/(61-82) 83/70 mmHg (12/09 0859) SpO2:  [87 %-100 %] 98 % (12/09 0800) Weight:  [241 lb 6.5 oz (109.5 kg)] 241 lb 6.5 oz (109.5 kg) (12/09 0500) Last BM Date: 09/15/15  Intake/Output from previous day: 12/08 0701 - 12/09 0700 In: 323.8 [I.V.:223.8; IV Piggyback:100] Out: 935 [Urine:795; Chest Tube:140] Intake/Output this shift: Total I/O In: 100 [IV Piggyback:100] Out: 75 [Urine:75]  Medications Scheduled Meds: . acetaminophen  1,000 mg Oral 4 times per day  . allopurinol  150 mg Oral Daily   And  . allopurinol  300 mg Oral QHS  . antiseptic oral rinse  7 mL Mouth Rinse QID  . aspirin EC  325 mg Oral Daily  . bisacodyl  10 mg Oral Daily   Or  . bisacodyl  10 mg Rectal Daily  . chlorhexidine  15 mL Mouth/Throat BID  . Chlorhexidine Gluconate Cloth  6 each Topical Q0600  . docusate sodium  200 mg Oral Daily  . enoxaparin (LOVENOX) injection  30 mg Subcutaneous Q24H  . furosemide  20 mg Intravenous BID  . [START ON 09/20/2015] furosemide  40 mg Oral Daily  . insulin aspart  0-24 Units Subcutaneous TID AC & HS  . metoprolol tartrate  12.5 mg Oral BID  . moving right along book   Does not apply Once  . mupirocin ointment  1 application Nasal BID  . pantoprazole  40 mg Oral Daily  . potassium chloride  10 mEq Intravenous Q1 Hr x 3  . [START ON 09/20/2015] potassium chloride  20 mEq Oral Daily  . pravastatin  80 mg Oral QPM  . sodium chloride  3 mL Intravenous Q12H  . sodium chloride  3 mL Intravenous Q12H   Continuous Infusions: . sodium chloride     PRN Meds:.sodium chloride, metoprolol, morphine injection, ondansetron (ZOFRAN) IV, oxyCODONE, sodium chloride, sodium chloride, traMADol  PE: General appearance:  alert, cooperative, no distress and Appears comfortable in bed. Resp rate controlled. Lungs: clear to auscultation bilaterally Heart: regular rate and rhythm, S1, S2 normal, no murmur, click, rub or gallop Abdomen: +BS Extremities: Trace to 1+ LEE Pulses: 1+ radials Skin: Warm and dry Neurologic: Grossly normal  Lab Results:   Recent Labs  09/18/15 0409 09/18/15 1730 09/18/15 1733 09/19/15 0512  WBC 20.1* 20.2*  --  17.7*  HGB 11.4* 12.0* 13.6 10.9*  HCT 34.9* 36.8* 40.0 32.5*  PLT 244 262  --  256   BMET  Recent Labs  09/16/15 1220  09/18/15 0409 09/18/15 1730 09/18/15 1733 09/19/15 0512  NA 139  < > 135  --  135 134*  K 4.2  < > 4.1  --  4.0 3.6  CL 102  < > 106  --  100* 104  CO2 27  --  21*  --   --  22  GLUCOSE 113*  < > 108*  --  154* 125*  BUN 10  < > 14  --  24* 18  CREATININE 1.20  < > 1.13 1.41* 1.30* 1.13  CALCIUM 9.6  --  8.0*  --   --  7.8*  < > = values in this interval not displayed. PT/INR  Recent Labs  09/16/15 1220 09/17/15 1455  LABPROT  14.4 17.5*  INR 1.10 1.43     Assessment/Plan  Principal Problem:   S/P CABG x 4 Post op day #2.  LIMA to LAD, SVG to distal RCA, SVG to D1, SVG to D2.  ASA, lasix, lopressor   HTN (hypertension)    Hypotensive but asymptomatic.  Appears comfortable in bed   Hyperlipemia  Statin   NSTEMI (non-ST elevated myocardial infarction) ([place])   Diabetes mellitus type 2 in obese [facility])   Coronary artery disease    LOS: 4 days    HAGER, BRYAN PA-C 09/19/2015 10:01 AM  Pt not seen. No charge.  Sherren Mocha

## 2015-09-19 NOTE — Progress Notes (Addendum)
[address]       [place] [address]             [phone]        CARDIOTHORACIC SURGERY PROGRESS NOTE   R2 Days Post-Op Procedure(s) (LRB): CORONARY ARTERY BYPASS GRAFTING (CABG) (N/A) TRANSESOPHAGEAL ECHOCARDIOGRAM (TEE) (N/A)  Subjective: Doing very well.  Mild soreness.  Good appetite.  No SOB  Objective: Vital signs: BP Readings from Last 1 Encounters:  09/19/15 83/70   Pulse Readings from Last 1 Encounters:  09/19/15 96   Resp Readings from Last 1 Encounters:  09/19/15 21   Temp Readings from Last 1 Encounters:  09/19/15 98.3 F (36.8 C) Axillary    Hemodynamics: PAP: (18)/(10) 18/10 mmHg  Physical Exam:  Rhythm:   sinus  Breath sounds: clear  Heart sounds:  RRR  Incisions:  Dressing dry, intact  Abdomen:  Soft, non-distended, non-tender  Extremities:  Warm, well-perfused    Intake/Output from previous day: 12/08 0701 - 12/09 0700 In: 323.8 [I.V.:223.8; IV Piggyback:100] Out: 935 [Urine:795; Chest Tube:140] Intake/Output this shift: Total I/O In: 50 [IV Piggyback:50] Out: 75 [Urine:75]  Lab Results:  CBC: Recent Labs  09/18/15 1730 09/18/15 1733 09/19/15 0512  WBC 20.2*  --  17.7*  HGB 12.0* 13.6 10.9*  HCT 36.8* 40.0 32.5*  PLT 262  --  256    BMET:  Recent Labs  09/18/15 0409  09/18/15 1733 09/19/15 0512  NA 135  --  135 134*  K 4.1  --  4.0 3.6  CL 106  --  100* 104  CO2 21*  --   --  22  GLUCOSE 108*  --  154* 125*  BUN 14  --  24* 18  CREATININE 1.13  < > 1.30* 1.13  CALCIUM 8.0*  --   --  7.8*  < > = values in this interval not displayed.   PT/INR:   Recent Labs  09/17/15 1455  LABPROT 17.5*  INR 1.43    CBG (last 3)   Recent Labs  09/18/15 2335 09/19/15 0336 09/19/15 0733  GLUCAP 112* 102* 122*    ABG    Component Value Date/Time   PHART 7.396 09/18/2015 0637   PCO2ART 35.1 09/18/2015 0637   PO2ART 119.0* 09/18/2015 0637   HCO3 22.2 09/18/2015 1727   TCO2 23 09/18/2015  1733   ACIDBASEDEF 3.0* 09/18/2015 1727   O2SAT 53.0 09/18/2015 1727    CXR: PORTABLE CHEST 1 VIEW  COMPARISON: 09/18/2015 and 09/15/2015  FINDINGS: Swan-Ganz catheter has been removed. Right jugular central line is still present. Chest drains have been removed. Negative for a pneumothorax. Heart and mediastinum are stable. Increased densities throughout the left lower chest are most compatible with atelectasis. Overall, low lung volumes. There is a more focal density near the anterior left third rib. In retrospect, there may have been a vague density in this region on 09/15/2015.  IMPRESSION: Removal of support apparatuses without pneumothorax.  Left lung atelectasis.  Questionable nodular density in the left lung as described. Recommend follow-up PA and lateral view of chest.   Electronically Signed  By: Markus Daft M.D.  On: 09/19/2015 07:53  Assessment/Plan: S/P Procedure(s) (LRB): CORONARY ARTERY BYPASS GRAFTING (CABG) (N/A) TRANSESOPHAGEAL ECHOCARDIOGRAM (TEE) (N/A)  Doing well POD2 Maintaining NSR w/ stable BP off Neo drip S/P Acute Inferior MI, mild LV dysfunction Expected post op acute blood loss anemia, mild, stable Expected post op atelectasis, mild Expected post op volume excess, mild  Leukocytosis, likely reactive and secondary to surgery and acute MI, trending down Hypertension Hyperlipidemia Type II diabetes mellitus, excellent glycemic control   Mobilize  Diuresis  Change CBG's and SSI to ac/hs  Continue ASA, beta blocker, statin  Restart metformin as oral intake improves  Restart Cozaar if and when BP will allow  Transfer 2W  Possible d/c home 2-3 days   Rexene Alberts, MD 09/19/2015 9:29 AM

## 2015-09-19 NOTE — Discharge Instructions (Signed)
Endoscopic Saphenous Vein Harvesting, Care After °Refer to this sheet in the next few weeks. These instructions provide you with information on caring for yourself after your procedure. Your health care provider may also give you more specific instructions. Your treatment has been planned according to current medical practices, but problems sometimes occur. Call your health care provider if you have any problems or questions after your procedure. °HOME CARE INSTRUCTIONS °Medicine °· Take whatever pain medicine your surgeon prescribes. Follow the directions carefully. Do not take over-the-counter pain medicine unless your surgeon says it is okay. Some pain medicine can cause bleeding problems for several weeks after surgery. °· Follow your surgeon's instructions about driving. You will probably not be permitted to drive after heart surgery. °· Take any medicines your surgeon prescribes. Any medicines you took before your heart surgery should be checked with your health care provider before you start taking them again. °Wound care °· If your surgeon has prescribed an elastic bandage or stocking, ask how long you should wear it. °· Check the area around your surgical cuts (incisions) whenever your bandages (dressings) are changed. Look for any redness or swelling. °· You will need to return to have the stitches (sutures) or staples taken out. Ask your surgeon when to do that. °· Ask your surgeon when you can shower or bathe. °Activity °· Try to keep your legs raised when you are sitting. °· Do any exercises your health care providers have given you. These may include deep breathing exercises, coughing, walking, or other exercises. °SEEK MEDICAL CARE IF: °· You have any questions about your medicines. °· You have more leg pain, especially if your pain medicine stops working. °· New or growing bruises develop on your leg. °· Your leg swells, feels tight, or becomes red. °· You have numbness in your leg. °SEEK IMMEDIATE  MEDICAL CARE IF: °· Your pain gets much worse. °· Blood or fluid leaks from any of the incisions. °· Your incisions become warm, swollen, or red. °· You have chest pain. °· You have trouble breathing. °· You have a fever. °· You have more pain near your leg incision. °MAKE SURE YOU: °· Understand these instructions. °· Will watch your condition. °· Will get help right away if you are not doing well or get worse. °  °This information is not intended to replace advice given to you by your health care provider. Make sure you discuss any questions you have with your health care provider. °  °Document Released: 06/09/2011 Document Revised: 10/18/2014 Document Reviewed: 06/09/2011 °Elsevier Interactive Patient Education ©2016 [employer] °Coronary Artery Bypass Grafting, Care After °These instructions give you information on caring for yourself after your procedure. Your doctor may also give you more specific instructions. Call your doctor if you have any problems or questions after your procedure.  °HOME CARE °· Only take medicine as told by your doctor. Take medicines exactly as told. Do not stop taking medicines or start any new medicines without talking to your doctor first. °· Take your pulse as told by your doctor. °· Do deep breathing as told by your doctor. Use your breathing device (incentive spirometer), if given, to practice deep breathing several times a day. Support your chest with a pillow or your arms when you take deep breaths or cough. °· Keep the area clean, dry, and protected where the surgery cuts (incisions) were made. Remove bandages (dressings) only as told by your doctor. If strips were applied to surgical area, do not take   them off. They fall off on their own. °· Check the surgery area daily for puffiness (swelling), redness, or leaking fluid. °· If surgery cuts were made in your legs: °· Avoid crossing your legs. °· Avoid sitting for long periods of time. Change positions every 30  minutes. °· Raise your legs when you are sitting. Place them on pillows. °· Wear stockings that help keep blood clots from forming in your legs (compression stockings). °· Only take sponge baths until your doctor says it is okay to take showers. Pat the surgery area dry. Do not rub the surgery area with a washcloth or towel. Do not bathe, swim, or use a hot tub until your doctor says it is okay. °· Eat foods that are high in fiber. These include raw fruits and vegetables, whole grains, beans, and nuts. Choose lean meats. Avoid canned, processed, and fried foods. °· Drink enough fluids to keep your pee (urine) clear or pale yellow. °· Weigh yourself every day. °· Rest and limit activity as told by your doctor. You may be told to: °· Stop any activity if you have chest pain, shortness of breath, changes in heartbeat, or dizziness. Get help right away if this happens. °· Move around often for short amounts of time or take short walks as told by your doctor. Gradually become more active. You may need help to strengthen your muscles and build endurance. °· Avoid lifting, pushing, or pulling anything heavier than 10 pounds (4.5 kg) for at least 6 weeks after surgery. °· Do not drive until your doctor says it is okay. °· Ask your doctor when you can go back to work. °· Ask your doctor when you can begin sexual activity again. °· Follow up with your doctor as told. °GET HELP IF: °· You have puffiness, redness, more pain, or fluid draining from the incision site. °· You have a fever. °· You have puffiness in your ankles or legs. °· You have pain in your legs. °· You gain 2 or more pounds (0.9 kg) a day. °· You feel sick to your stomach (nauseous) or throw up (vomit). °· You have watery poop (diarrhea). °GET HELP RIGHT AWAY IF: °· You have chest pain that goes to your jaw or arms. °· You have shortness of breath. °· You have a fast or irregular heartbeat. °· You notice a "clicking" in your breastbone when you move. °· You  have numbness or weakness in your arms or legs. °· You feel dizzy or light-headed. °MAKE SURE YOU: °· Understand these instructions. °· Will watch your condition. °· Will get help right away if you are not doing well or get worse. °  °This information is not intended to replace advice given to you by your health care provider. Make sure you discuss any questions you have with your health care provider. °  °Document Released: 10/02/2013 Document Reviewed: 10/02/2013 °Elsevier Interactive Patient Education ©2016 [employer] ° °

## 2015-09-20 ENCOUNTER — Inpatient Hospital Stay (HOSPITAL_COMMUNITY): Payer: Managed Care, Other (non HMO)

## 2015-09-20 LAB — BASIC METABOLIC PANEL
Anion gap: 8 (ref 5–15)
BUN: 16 mg/dL (ref 6–20)
CHLORIDE: 101 mmol/L (ref 101–111)
CO2: 25 mmol/L (ref 22–32)
CREATININE: 1.06 mg/dL (ref 0.61–1.24)
Calcium: 8.2 mg/dL — ABNORMAL LOW (ref 8.9–10.3)
GFR calc Af Amer: >60 mL/min (ref >60)
GFR calc non Af Amer: >60 mL/min (ref >60)
Glucose, Bld: 102 mg/dL — ABNORMAL HIGH (ref 65–99)
Potassium: 3.4 mmol/L — ABNORMAL LOW (ref 3.5–5.1)
Sodium: 134 mmol/L — ABNORMAL LOW (ref 135–145)

## 2015-09-20 LAB — GLUCOSE, CAPILLARY
GLUCOSE-CAPILLARY: 129 mg/dL — AB (ref 65–99)
GLUCOSE-CAPILLARY: 89 mg/dL (ref 65–99)
Glucose-Capillary: 84 mg/dL (ref 65–99)
Glucose-Capillary: 102 mg/dL — ABNORMAL HIGH (ref 65–99)

## 2015-09-20 LAB — CBC
HEMATOCRIT: 34.5 % — AB (ref 39.0–52.0)
HEMOGLOBIN: 11.6 g/dL — AB (ref 13.0–17.0)
MCH: 28.7 pg (ref 26.0–34.0)
MCHC: 33.6 g/dL (ref 30.0–36.0)
MCV: 85.4 fL (ref 78.0–100.0)
Platelets: 294 10*3/uL (ref 150–400)
RBC: 4.04 MIL/uL — ABNORMAL LOW (ref 4.22–5.81)
RDW: 15.3 % (ref 11.5–15.5)
WBC: 17.5 10*3/uL — ABNORMAL HIGH (ref 4.0–10.5)

## 2015-09-20 MED ORDER — METFORMIN HCL ER 500 MG PO TB24
500.0000 mg | ORAL_TABLET | Freq: Two times a day (BID) | ORAL | Status: DC
  Administered 2015-09-20 – 2015-09-21 (×3): 500 mg via ORAL
  Filled 2015-09-20 (×3): qty 1

## 2015-09-20 MED ORDER — POTASSIUM CHLORIDE CRYS ER 20 MEQ PO TBCR
40.0000 meq | EXTENDED_RELEASE_TABLET | Freq: Every day | ORAL | Status: DC
  Administered 2015-09-20 – 2015-09-21 (×2): 40 meq via ORAL
  Filled 2015-09-20 (×2): qty 2

## 2015-09-20 NOTE — Progress Notes (Signed)
Pacing wires removed per order.  Pt tol well.  Wires intact upon removal.  Will cont to monitor VS.

## 2015-09-20 NOTE — Progress Notes (Addendum)
[address]       [place] [address]             [phone]      3 Days Post-Op Procedure(s) (LRB): CORONARY ARTERY BYPASS GRAFTING (CABG) (N/A) TRANSESOPHAGEAL ECHOCARDIOGRAM (TEE) (N/A) Subjective: Feels well, no new specific issues  Objective: Vital signs in last 24 hours: Temp:  [98.3 F (36.8 C)-98.7 F (37.1 C)] 98.7 F (37.1 C) (12/10 0402) Pulse Rate:  [79-97] 80 (12/10 0402) Cardiac Rhythm:  [-] Normal sinus rhythm;Bundle branch block (12/09 1900) Resp:  [17-24] 18 (12/10 0402) BP: (83-121)/(59-83) 105/67 mmHg (12/10 0402) SpO2:  [94 %-100 %] 94 % (12/10 0402) Weight:  [237 lb 8 oz (107.729 kg)] 237 lb 8 oz (107.729 kg) (12/10 0402)  Hemodynamic parameters for last 24 hours:    Intake/Output from previous day: 12/09 0701 - 12/10 0700 In: 500 [P.O.:350; IV Piggyback:150] Out: 2005 [Urine:2005] Intake/Output this shift:    General appearance: alert, cooperative and no distress Heart: regular rate and rhythm Lungs: clear to auscultation bilaterally Abdomen: benign Extremities: minor edema Wound: chest dressing intact, evh site healing well  Lab Results:  Recent Labs  09/19/15 0512 09/20/15 0242  WBC 17.7* 17.5*  HGB 10.9* 11.6*  HCT 32.5* 34.5*  PLT 256 294   BMET:  Recent Labs  09/19/15 0512 09/20/15 0242  NA 134* 134*  K 3.6 3.4*  CL 104 101  CO2 22 25  GLUCOSE 125* 102*  BUN 18 16  CREATININE 1.13 1.06  CALCIUM 7.8* 8.2*    PT/INR:  Recent Labs  09/17/15 1455  LABPROT 17.5*  INR 1.43   ABG    Component Value Date/Time   PHART 7.396 09/18/2015 0637   HCO3 22.2 09/18/2015 1727   TCO2 23 09/18/2015 1733   ACIDBASEDEF 3.0* 09/18/2015 1727   O2SAT 53.0 09/18/2015 1727   CBG (last 3)   Recent Labs  09/19/15 2111 09/19/15 2309 09/20/15 0642  GLUCAP 146* 125* 84    Meds Scheduled Meds: . acetaminophen  1,000 mg Oral 4 times per day  . allopurinol  150 mg Oral Daily   And  . allopurinol  300  mg Oral QHS  . aspirin EC  325 mg Oral Daily  . bisacodyl  10 mg Oral Daily   Or  . bisacodyl  10 mg Rectal Daily  . Chlorhexidine Gluconate Cloth  6 each Topical Q0600  . docusate sodium  200 mg Oral Daily  . enoxaparin (LOVENOX) injection  30 mg Subcutaneous Q24H  . furosemide  40 mg Oral Daily  . insulin aspart  0-24 Units Subcutaneous TID AC & HS  . metoprolol tartrate  12.5 mg Oral BID  . mupirocin ointment  1 application Nasal BID  . pantoprazole  40 mg Oral Daily  . potassium chloride  20 mEq Oral Daily  . pravastatin  80 mg Oral QPM  . sodium chloride  3 mL Intravenous Q12H  . sodium chloride  3 mL Intravenous Q12H   Continuous Infusions: . sodium chloride     PRN Meds:.sodium chloride, metoprolol, morphine injection, ondansetron (ZOFRAN) IV, oxyCODONE, sodium chloride, sodium chloride, traMADol  Xrays  Dg Chest 2 View  09/20/2015  CLINICAL DATA:  CABG on 09/17/2015 EXAM: CHEST  2 VIEW COMPARISON:  09/19/2015 FINDINGS: Removal of IJ sheath. Sternotomy wires overlie stable cardiac silhouette. There is LEFT basilar atelectasis and pleural thickening along the LEFT lateral chest wall not changed from prior. No evidence of  pneumothorax. No pulmonary edema. IMPRESSION: 1. No significant change. 2. Persistent LEFT basilar atelectasis lateral pleural thickening Electronically Signed   By: Suzy Bouchard M.D.   On: 09/20/2015 07:51   Dg Chest Port 1 View  09/19/2015  CLINICAL DATA:  Coronary artery disease and post CABG. EXAM: PORTABLE CHEST 1 VIEW COMPARISON:  09/18/2015 and 09/15/2015 FINDINGS: Swan-Ganz catheter has been removed. Right jugular central line is still present. Chest drains have been removed. Negative for a pneumothorax. Heart and mediastinum are stable. Increased densities throughout the left lower chest are most compatible with atelectasis. Overall, low lung volumes. There is a more focal density near the anterior left third rib. In retrospect, there may have been a  vague density in this region on 09/15/2015. IMPRESSION: Removal of support apparatuses without pneumothorax. Left lung atelectasis. Questionable nodular density in the left lung as described. Recommend follow-up PA and lateral view of chest. Electronically Signed   By: Markus Daft M.D.   On: 09/19/2015 07:53    Assessment/Plan: S/P Procedure(s) (LRB): CORONARY ARTERY BYPASS GRAFTING (CABG) (N/A) TRANSESOPHAGEAL ECHOCARDIOGRAM (TEE) (N/A)  1 conts to make good overall progress 2 cont gentle diuresis for volume overload, replace K+ 3 restart glucophage at home dose 4 BP too low currently to restart cozaar. Can d/c pacer wires 5 leukocytosis trending down slightly- no fevers 6 cont pulm toilet and rehab- routine 7 poss home in am  LOS: 5 days    GOLD,WAYNE E 09/20/2015  Doing well post op Wires out today Poss home in am I have seen and examined Dianne Dun and agree with the above assessment  and plan.  Grace Isaac MD Beeper [phone] Office [phone] 09/20/2015 10:15 AM

## 2015-09-20 NOTE — Progress Notes (Signed)
Pacing wires pulled, patient is on bedrest at this time.  Education completed with patient and wife re: sternal precautions, activity restrictions, signs of infection, exercise and activity progression.  Referred to phase II cardiac rehab in [place], [place].  Patient is walking independently in halls without difficulty, he looks really good, for possible discharge tomorrow.   [id number]

## 2015-09-21 LAB — BASIC METABOLIC PANEL
Anion gap: 9 (ref 5–15)
BUN: 18 mg/dL (ref 6–20)
CALCIUM: 8.6 mg/dL — AB (ref 8.9–10.3)
CO2: 26 mmol/L (ref 22–32)
CREATININE: 1.2 mg/dL (ref 0.61–1.24)
Chloride: 100 mmol/L — ABNORMAL LOW (ref 101–111)
GFR calc non Af Amer: >60 mL/min (ref >60)
Glucose, Bld: 118 mg/dL — ABNORMAL HIGH (ref 65–99)
Potassium: 3.8 mmol/L (ref 3.5–5.1)
SODIUM: 135 mmol/L (ref 135–145)

## 2015-09-21 LAB — GLUCOSE, CAPILLARY: GLUCOSE-CAPILLARY: 105 mg/dL — AB (ref 65–99)

## 2015-09-21 MED ORDER — METOPROLOL TARTRATE 25 MG PO TABS: 12.5000 mg | ORAL_TABLET | Freq: Two times a day (BID) | ORAL | Status: DC

## 2015-09-21 MED ORDER — POTASSIUM CHLORIDE CRYS ER 20 MEQ PO TBCR: 20.0000 meq | EXTENDED_RELEASE_TABLET | Freq: Every day | ORAL | Status: DC

## 2015-09-21 MED ORDER — ASPIRIN 325 MG PO TBEC: 325.0000 mg | DELAYED_RELEASE_TABLET | Freq: Every day | ORAL | Status: DC

## 2015-09-21 MED ORDER — FUROSEMIDE 40 MG PO TABS: 40.0000 mg | ORAL_TABLET | Freq: Every day | ORAL | Status: DC

## 2015-09-21 MED ORDER — OXYCODONE HCL 5 MG PO TABS: 5.0000 mg | ORAL_TABLET | ORAL | Status: DC | PRN

## 2015-09-21 NOTE — Progress Notes (Signed)
Discharge order received.  Pt removed from telemetry and CCMD notified.  IV removed with catheter intact.  Discharge education given to Pt with wife at bedside.  Discharge medications covered in detail and Pt and wife indicate understanding of what medications should be continued and current medications that have been discontinued (Cozaar).  All after care instructions given and questions answered.  Pt denies chest pain or sob.  Pt discharged via [place] by this RN.  Pt stable at discharge, no S/S of distress.

## 2015-09-21 NOTE — Progress Notes (Signed)
Chest sutures removed.  Sutures intact.  Steristrips placed.  Pt tol well.  No leaking noted.

## 2015-09-21 NOTE — Progress Notes (Addendum)
[address]       [place] [address]             [phone]      4 Days Post-Op Procedure(s) (LRB): CORONARY ARTERY BYPASS GRAFTING (CABG) (N/A) TRANSESOPHAGEAL ECHOCARDIOGRAM (TEE) (N/A) Subjective: Feels well, no new issues  Objective: Vital signs in last 24 hours: Temp:  [98.2 F (36.8 C)-98.3 F (36.8 C)] 98.3 F (36.8 C) (12/11 0404) Pulse Rate:  [75-86] 85 (12/11 0404) Cardiac Rhythm:  [-] Heart block;Bundle branch block (12/10 1930) Resp:  [16-18] 16 (12/11 0404) BP: (104-126)/(62-74) 125/74 mmHg (12/11 0404) SpO2:  [97 %-99 %] 99 % (12/11 0404) Weight:  [235 lb 14.4 oz (107.004 kg)] 235 lb 14.4 oz (107.004 kg) (12/11 0404)  Hemodynamic parameters for last 24 hours:    Intake/Output from previous day: 12/10 0701 - 12/11 0700 In: 120 [P.O.:120] Out: 1002 [Urine:1000; Stool:2] Intake/Output this shift:    General appearance: alert, cooperative and no distress Heart: regular rate and rhythm Lungs: clear to auscultation bilaterally Abdomen: benign Extremities: + LE edema Wound: incis healing well  Lab Results:  Recent Labs  09/19/15 0512 09/20/15 0242  WBC 17.7* 17.5*  HGB 10.9* 11.6*  HCT 32.5* 34.5*  PLT 256 294   BMET:  Recent Labs  09/20/15 0242 09/21/15 0239  NA 134* 135  K 3.4* 3.8  CL 101 100*  CO2 25 26  GLUCOSE 102* 118*  BUN 16 18  CREATININE 1.06 1.20  CALCIUM 8.2* 8.6*    PT/INR: No results for input(s): LABPROT, INR in the last 72 hours. ABG    Component Value Date/Time   PHART 7.396 09/18/2015 0637   HCO3 22.2 09/18/2015 1727   TCO2 23 09/18/2015 1733   ACIDBASEDEF 3.0* 09/18/2015 1727   O2SAT 53.0 09/18/2015 1727   CBG (last 3)   Recent Labs  09/20/15 1632 09/20/15 2132 09/21/15 0601  GLUCAP 129* 89 105*    Meds Scheduled Meds: . acetaminophen  1,000 mg Oral 4 times per day  . allopurinol  150 mg Oral Daily   And  . allopurinol  300 mg Oral QHS  . aspirin EC  325 mg Oral Daily  .  bisacodyl  10 mg Oral Daily   Or  . bisacodyl  10 mg Rectal Daily  . Chlorhexidine Gluconate Cloth  6 each Topical Q0600  . docusate sodium  200 mg Oral Daily  . enoxaparin (LOVENOX) injection  30 mg Subcutaneous Q24H  . furosemide  40 mg Oral Daily  . insulin aspart  0-24 Units Subcutaneous TID AC & HS  . metFORMIN  500 mg Oral BID WC  . metoprolol tartrate  12.5 mg Oral BID  . mupirocin ointment  1 application Nasal BID  . pantoprazole  40 mg Oral Daily  . potassium chloride  40 mEq Oral Daily  . pravastatin  80 mg Oral QPM  . sodium chloride  3 mL Intravenous Q12H  . sodium chloride  3 mL Intravenous Q12H   Continuous Infusions: . sodium chloride     PRN Meds:.sodium chloride, metoprolol, morphine injection, ondansetron (ZOFRAN) IV, oxyCODONE, sodium chloride, sodium chloride, traMADol  Xrays Dg Chest 2 View  09/20/2015  CLINICAL DATA:  CABG on 09/17/2015 EXAM: CHEST  2 VIEW COMPARISON:  09/19/2015 FINDINGS: Removal of IJ sheath. Sternotomy wires overlie stable cardiac silhouette. There is LEFT basilar atelectasis and pleural thickening along the LEFT lateral chest wall not changed from prior. No evidence of pneumothorax.  No pulmonary edema. IMPRESSION: 1. No significant change. 2. Persistent LEFT basilar atelectasis lateral pleural thickening Electronically Signed   By: Suzy Bouchard M.D.   On: 09/20/2015 07:51    Assessment/Plan: S/P Procedure(s) (LRB): CORONARY ARTERY BYPASS GRAFTING (CABG) (N/A) TRANSESOPHAGEAL ECHOCARDIOGRAM (TEE) (N/A) Plan for discharge: see discharge orders Will need diuretics short term BP stable but too low to start ARB currently Sugars well controlled  LOS: 6 days    GOLD,WAYNE E 09/21/2015  Plan d/c today, feels well I have seen and examined Dianne Dun and agree with the above assessment  and plan.  Grace Isaac MD Beeper [phone] Office [phone] 09/21/2015 9:13 AM

## 2015-09-23 MED FILL — Dexmedetomidine HCl in NaCl 0.9% IV Soln 400 MCG/100ML: INTRAVENOUS | Qty: 100 | Status: AC

## 2015-10-04 ENCOUNTER — Other Ambulatory Visit: Payer: Self-pay | Admitting: Physician Assistant

## 2015-10-04 MED ORDER — BENZONATATE 100 MG PO CAPS: 100.0000 mg | ORAL_CAPSULE | Freq: Three times a day (TID) | ORAL | Status: DC | PRN

## 2015-10-08 ENCOUNTER — Ambulatory Visit (INDEPENDENT_AMBULATORY_CARE_PROVIDER_SITE_OTHER): Payer: Managed Care, Other (non HMO) | Admitting: Cardiology

## 2015-10-08 ENCOUNTER — Encounter: Payer: Self-pay | Admitting: Cardiology

## 2015-10-08 VITALS — BP 132/90 | HR 80 | Ht 70.0 in | Wt 217.4 lb

## 2015-10-08 DIAGNOSIS — I214 Non-ST elevation (NSTEMI) myocardial infarction: Secondary | ICD-10-CM

## 2015-10-08 DIAGNOSIS — I2510 Atherosclerotic heart disease of native coronary artery without angina pectoris: Secondary | ICD-10-CM | POA: Diagnosis not present

## 2015-10-08 DIAGNOSIS — Z951 Presence of aortocoronary bypass graft: Secondary | ICD-10-CM

## 2015-10-08 DIAGNOSIS — E785 Hyperlipidemia, unspecified: Secondary | ICD-10-CM

## 2015-10-08 DIAGNOSIS — I10 Essential (primary) hypertension: Secondary | ICD-10-CM | POA: Diagnosis not present

## 2015-10-08 LAB — CBC WITH DIFFERENTIAL/PLATELET
Basophils Absolute: 0.1 10*3/uL (ref 0.0–0.1)
Basophils Relative: 1 % (ref 0–1)
Eosinophils Absolute: 0.4 10*3/uL (ref 0.0–0.7)
Eosinophils Relative: 4 % (ref 0–5)
HEMATOCRIT: 39.5 % (ref 39.0–52.0)
HEMOGLOBIN: 13 g/dL (ref 13.0–17.0)
LYMPHS ABS: 4 10*3/uL (ref 0.7–4.0)
LYMPHS PCT: 37 % (ref 12–46)
MCH: 27.5 pg (ref 26.0–34.0)
MCHC: 32.9 g/dL (ref 30.0–36.0)
MCV: 83.7 fL (ref 78.0–100.0)
MONO ABS: 1.2 10*3/uL — AB (ref 0.1–1.0)
MONOS PCT: 11 % (ref 3–12)
MPV: 9.7 fL (ref 8.6–12.4)
NEUTROS ABS: 5.1 10*3/uL (ref 1.7–7.7)
Neutrophils Relative %: 47 % (ref 43–77)
Platelets: 802 10*3/uL — ABNORMAL HIGH (ref 150–400)
RBC: 4.72 MIL/uL (ref 4.22–5.81)
RDW: 14.4 % (ref 11.5–15.5)
WBC: 10.9 10*3/uL — ABNORMAL HIGH (ref 4.0–10.5)

## 2015-10-08 LAB — BASIC METABOLIC PANEL
BUN: 15 mg/dL (ref 7–25)
CALCIUM: 9.9 mg/dL (ref 8.6–10.3)
CO2: 23 mmol/L (ref 20–31)
Chloride: 100 mmol/L (ref 98–110)
Creat: 1 mg/dL (ref 0.70–1.33)
GLUCOSE: 82 mg/dL (ref 65–99)
Potassium: 3.9 mmol/L (ref 3.5–5.3)
Sodium: 137 mmol/L (ref 135–146)

## 2015-10-08 MED ORDER — LOSARTAN POTASSIUM 25 MG PO TABS: 25.0000 mg | ORAL_TABLET | Freq: Every day | ORAL | Status: DC

## 2015-10-08 NOTE — Progress Notes (Signed)
Cardiology Office Note   Date:  10/08/2015   ID:  DELVANTE HILLING, DOB 03/13/1956, MRN [id number]  PCP:  Marylene Land, MD  Cardiologist:  Dr. Burt Knack    Chief Complaint  Patient presents with  . Hospitalization Follow-up    no complaints      History of Present Illness: Timothy Gentry is a 59 y.o. male who presents for post NSTEMI/CABG   59 y.o. male w/ PMHx significant for HTN, diabetes, hyperlipidemia who presented to [facility] on [facility]/15/2015 with complaints of jaw pain. The patient complains of 48 hours of jaw pain. There has also been chest tightness intermittently over the same time period. He complains of marked fatigue and shortness of breath with activity. All of these symptoms began abruptly.  + NSTEMI on admit- He underwent cath with severe 3 vessel disease.  EF 45%.  Consult with Dr. Roxy Manns and CABG 09/17/15 X$.  See below.  Today he is doing well except cough -he is on tessalon pearls which have helped a great deal.  No chest pain and no SOB unless he really pushes his walking.  He was referred to phase 2 cardiac rehab in [place].  He is walking for exercise now.  His appetite is good.  He does have drainage from chest tube sites.  On tele strips pt with PVCs which continue today.  Past Medical History  Diagnosis Date  . Hematuria   . Diabetes mellitus without complication ([place])     Type II  . Hyperlipidemia   . Hypertension   . Colon polyps   . Degenerative disc disease, cervical   . Chronic kidney disease     Chronic, Stage III  . Colon ulcer     Possible NSAID induced  . Cancer ([place])     skin  . NSTEMI (non-ST elevated myocardial infarction) ([place]) 09/15/2015  . Coronary artery disease 09/15/2015  . 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 Revision total hip arthroplasty  2010  .  Colonoscopy  09/18/2009  . Knee surgery Right 1983    Open repair of meniscus of knee  . Lumbar laminectomy  2001  . Lumbar fusion  2002  . Colonoscopy N/A 09/24/2014    Procedure: COLONOSCOPY;  Surgeon: Mayme Genta, MD;  Location: [facility];  Service: Endoscopy;  Laterality: N/A;  . Esophagogastroduodenoscopy N/A 09/24/2014    Procedure: ESOPHAGOGASTRODUODENOSCOPY (EGD);  Surgeon: Mayme Genta, MD;  Location: [facility];  Service: Endoscopy;  Laterality: N/A;  . Cardiac catheterization N/A 09/15/2015    Procedure: Left Heart Cath and Coronary Angiography;  Surgeon: Peter M Martinique, MD;  Location: [facility];  Service: Cardiovascular;  Laterality: N/A;  . Distal pancreatectomy      benign neuroendocrine tumor  . Splenectomy    . Coronary artery bypass graft N/A 09/17/2015    Procedure: CORONARY ARTERY BYPASS GRAFTING (CABG);  Surgeon: Rexene Alberts, MD;  Location: [facility];  Service: Open Heart Surgery;  Laterality: N/A;  . Tee without cardioversion N/A 09/17/2015    Procedure: TRANSESOPHAGEAL ECHOCARDIOGRAM (TEE);  Surgeon: Rexene Alberts, MD;  Location: [facility];  Service: Open Heart Surgery;  Laterality: N/A;     Current Outpatient Prescriptions  Medication Sig Dispense Refill  . allopurinol (  ZYLOPRIM) 300 MG tablet Take 150-300 mg by mouth 2 (two) times daily. 150 mg in the morning and 300 mg in the evening.    Marland Kitchen aspirin EC 325 MG EC tablet Take 1 tablet (325 mg total) by mouth daily.    . benzonatate (TESSALON PERLES) 100 MG capsule Take 1 capsule (100 mg total) by mouth 3 (three) times daily as needed for cough. 20 capsule 0  . losartan (COZAAR) 25 MG tablet Take 1 tablet (25 mg total) by mouth daily. 30 tablet 3  . metFORMIN (GLUCOPHAGE-XR) 500 MG 24 hr tablet Take 500 mg by mouth 2 (two) times daily.     . metoprolol tartrate (LOPRESSOR) 25 MG tablet Take 0.5 tablets (12.5 mg total) by mouth 2 (two) times daily. 31 tablet 1  . Multiple Vitamin (MULTIVITAMIN) tablet  Take 1 tablet by mouth daily.    . pravastatin (PRAVACHOL) 80 MG tablet Take 80 mg by mouth every evening.      No current facility-administered medications for this visit.    Allergies:   Celebrex    Social History:  The patient  reports that he quit smoking about 36 years ago. His smoking use included Cigarettes. He does not have any smokeless tobacco history on file. He reports that he drinks alcohol. He reports that he does not use illicit drugs.   Family History:  The patient's family history includes Cancer in his father.    ROS:  General:no colds or fevers, + weight changes- he is now a little below his normal wt.  He is off lasix.  Skin:no rashes or ulcers, + open chst tube sites HEENT:no blurred vision, no congestion CV:see HPI PUL:see HPI GI:no diarrhea constipation or melena, no indigestion GU:no hematuria, no dysuria MS:no joint pain, no claudication Neuro:no syncope, no lightheadedness Endo:no diabetes, no thyroid disease  Wt Readings from Last 3 Encounters:  10/08/15 217 lb 6.4 oz (98.612 kg)  09/21/15 235 lb 14.4 oz (107.004 kg)  09/23/14 221 lb 12.5 oz (100.6 kg)     PHYSICAL EXAM: VS:  BP 132/90 mmHg  Pulse 80  Ht 5\' 10"  (1.778 m)  Wt 217 lb 6.4 oz (98.612 kg)  BMI 31.19 kg/m2 , BMI Body mass index is 31.19 kg/(m^2). General:Pleasant affect, NAD Skin:Warm and dry, brisk capillary refill HEENT:normocephalic, sclera clear, mucus membranes moist Neck:supple, no JVD, no bruits  Heart:S1S2 RRR without murmur, gallup, rub or click, chest wall incision healing well Lungs:clear without rales, rhonchi, or wheezes [id number], non tender, + BS, do not palpate liver spleen or masses, chest tube incision sites with 1/2 cm wide and deep with clear fluid.    Ext:no lower ext edema, 2+ pedal pulses, 2+ radial pulses Neuro:alert and oriented  X 3, MAE, follows commands, + facial symmetry    EKG:  EKG is ordered today. The ekg ordered today demonstrates SR with PVCs  T  wave inversions II,III, AVF, V6  Similar to EKG 09/18/15    Recent Labs: 09/16/2015: ALT 27 09/18/2015: Magnesium 2.5* 09/20/2015: Hemoglobin 11.6*; Platelets 294 09/21/2015: BUN 18; Creatinine, Ser 1.20; Potassium 3.8; Sodium 135    Lipid Panel    Component Value Date/Time   CHOL 141 09/16/2015 0245   TRIG 117 09/16/2015 0245   HDL 34* 09/16/2015 0245   CHOLHDL 4.1 09/16/2015 0245   VLDL 23 09/16/2015 0245   LDLCALC 84 09/16/2015 0245       Other studies Reviewed: Additional studies/ records that were reviewed today include:   Cath.  Prox RCA to Mid RCA lesion, 99% stenosed.  Mid RCA lesion, 100% stenosed.  Prox Cx lesion, 90% stenosed.  Prox LAD to Mid LAD lesion, 75% stenosed.  Mid LAD lesion, 90% stenosed.  Dist LAD lesion, 95% stenosed.  There is mild left ventricular systolic dysfunction.  1. Severe 3 vessel obstructive CAD  -diffuse calcific 75% disease in the mid LAD. 90% stenosis in the mid LAD immediately after the second diagonal. 95% in the mid LAD  - 90% proximal LCx at the takeoff of the first OM which then bifurcates.  - 100% mid RCA with collaterals.  2. Mild LV dysfunction with EF 45%  Plan: Recommend CABG. Patient is currently pain free. Will transfer to unit for medical therapy. Dr. Servando Snare consulted.       Carotid Dopplers  Bilateral - 1% to 39% ICA stenosis. Vertebral artery flow is  antegrade  Echo: Study Conclusions  - Left ventricle: Small area of posterior lateral hypokinesis The cavity size was normal. Wall thickness was increased in a pattern of mild LVH. Systolic function was normal. The estimated ejection fraction was in the range of 50% to 55%. Wall motion was normal; there were no regional wall motion abnormalities. Left ventricular diastolic function parameters were normal. - Impressions: Abnormal GLS -14.5 regonally worse at the base  Impressions:  - Abnormal GLS -14.5 regonally worse at the  base  CABG:  Coronary Artery Bypass Grafting x 4 Left Internal Mammary Artery to Distal Left Anterior Descending Coronary Artery Saphenous Vein Graft to Distal Right Coronary Artery Saphenous Vein Graft to First Diagonal Branch Coronary Artery Sapheonous Vein Graft to Second Diagonal Branch Coronary Artery Endoscopic Vein Harvest from Right Thigh and Lower Leg   ASSESSMENT AND PLAN:  1.  CAD post NSTEMI and due to significant CAD underwent CABG.  Doing well. Plans to begin cardiac rehab 10/20/14 - follow up with Dr. Burt Knack in 6-8 weeks.   2. Hyperlipidemia on Pravachol 80 mg will recheck lipids in 6-8 weeks after appt with Dr. Burt Knack.   3. LV dysfunction add low dose ARB losartan 25 mg daily.  BP elevated today. At home they noted hr of 41 on occ so did not increase BB.  4. HTN mildly elevated add losartan.   5. Chest tube incisions with open wounds and clear liquid. Talked to RN at [facility], if no improvement with gauze dressing by Friday they have appt with [facility] to evaluate.    Current medicines are reviewed with the patient today.  The patient Has no concerns regarding medicines.  The following changes have been made:  See above Labs/ tests ordered today include:see above  Disposition:   FU:  see above  Lennie Muckle, NP  10/08/2015 4:26 PM    [facility] [place], [place], [place], [place] Phone: [phone]; Fax: [phone]

## 2015-10-08 NOTE — Patient Instructions (Signed)
Medication Instructions:  Your physician has recommended you make the following change in your medication:  1. Start Losartan ( 25 mg ) daily   Labwork: Your physician recommends that you have lab work today:cbc/bmet   Testing/Procedures: -None  Follow-Up: Theodosia Quay, RN, Dr. Antionette Char nurse will be in contact to schedule you an appointment to seen back in 6 weeks.  Any Other Special Instructions Will Be Listed Below (If Applicable).  If wound does not close up please keep appointment for surgeon on Friday.   If you need a refill on your cardiac medications before your next appointment, please call your pharmacy.

## 2015-10-10 ENCOUNTER — Ambulatory Visit: Payer: Self-pay

## 2015-10-16 ENCOUNTER — Telehealth: Payer: Self-pay | Admitting: *Deleted

## 2015-10-16 NOTE — Telephone Encounter (Signed)
Called pt, per Cecilie Kicks, NP, to advise him that his labs were stable.  Pt verbalized understanding.

## 2015-10-16 NOTE — Telephone Encounter (Signed)
-----   Message from Isaiah Serge, NP sent at 10/16/2015  3:26 PM EST ----- Labs stable.

## 2015-10-17 ENCOUNTER — Other Ambulatory Visit: Payer: Self-pay | Admitting: Thoracic Surgery (Cardiothoracic Vascular Surgery)

## 2015-10-17 DIAGNOSIS — Z951 Presence of aortocoronary bypass graft: Secondary | ICD-10-CM

## 2015-10-20 ENCOUNTER — Ambulatory Visit
Admission: RE | Admit: 2015-10-20 | Discharge: 2015-10-20 | Disposition: A | Discharge status: Home / Self Care | Payer: Managed Care, Other (non HMO) | Source: Ambulatory Visit | Attending: Thoracic Surgery (Cardiothoracic Vascular Surgery) | Admitting: Thoracic Surgery (Cardiothoracic Vascular Surgery)

## 2015-10-20 ENCOUNTER — Ambulatory Visit (INDEPENDENT_AMBULATORY_CARE_PROVIDER_SITE_OTHER): Payer: Self-pay | Admitting: Surgical

## 2015-10-20 VITALS — BP 133/80 | HR 45 | Resp 20 | Ht 70.0 in | Wt 214.0 lb

## 2015-10-20 DIAGNOSIS — Z951 Presence of aortocoronary bypass graft: Secondary | ICD-10-CM

## 2015-10-20 NOTE — Progress Notes (Signed)
[address]       [place] [address]             [phone]           [address]       [place] [address]             [phone]                  Deundre R Taha  Medical Record [id number] Date of Birth: 1956/03/08  Referring [id number], Legrand Como, MD Primary Cardiology: Primary Care:BLOMGREN,PETER F, MD  Chief Complaint:  Follow Up Visit CARDIOTHORACIC SURGERY OPERATIVE NOTE  Date of Procedure:09/17/2015  Preoperative Diagnosis:  Severe 3-vessel Coronary Artery Disease  S/P Acute Non-ST Elevation Myocardial Infarction  Postoperative Diagnosis:Same  Procedure:   Coronary Artery Bypass Grafting x 4 Left Internal Mammary Artery to Distal Left Anterior Descending Coronary Artery Saphenous Vein Graft to Distal Right Coronary Artery Saphenous Vein Graft to First Diagonal Branch Coronary Artery Sapheonous Vein Graft to Second Diagonal Branch Coronary Artery Endoscopic Vein Harvest from Right Thigh and Lower Leg  Surgeon:Clarence H. Roxy Manns, MD  Assistant:Kavon Valenza Orson Ape, PA-C  Anesthesia:Terry Massagee, MD  Operative Findings:  Mild LV systolic dysfunction  Good quality LIMA conduit for grafting  Good quality SVG conduit for grafting  Good quality target vessels for grafting  No suitable target vessel for grafting in distal left circumflex coronary artery territory    History of Present Illness:     Patient is a 60 year old male status post the above described procedure was seen in the office on today in routine follow-up. He reports that he continues to do quite well. He is not requiring any pain medication. He is not having any difficulties with angina or shortness of breath. He is tolerating routine activities and plans to start cardiac rehabilitation phase II soon. His blood pressure postoperatively was a  little on the low side and Ace inhibitor/ARB was not started. He has been seen by the cardiologist postoperatively and he has been started as his blood pressure was a little high during his visit at that time.        Zubrod Score: At the time of surgery this patient's most appropriate activity status/level should be described as: []     0    Normal activity, no symptoms []     1    Restricted in physical strenuous activity but ambulatory, able to do out light work []     2    Ambulatory and capable of self care, unable to do work activities, up and about                 >50 % of waking hours                                                                                   []     3    Only limited self care, in bed greater than 50% of waking hours []     4    Completely disabled, no self care, confined to bed or chair []     5  Moribund  History  Smoking status  . Former Smoker  . Types: Cigarettes  . Quit date: 03/30/1979  Smokeless tobacco  . Not on file       Allergies  Allergen Reactions  . Celebrex [Celecoxib] Other (See Comments)    Patient passed out    Current Outpatient Prescriptions  Medication Sig Dispense Refill  . allopurinol (ZYLOPRIM) 300 MG tablet Take 150-300 mg by mouth 2 (two) times daily. 150 mg in the morning and 300 mg in the evening.    Marland Kitchen aspirin EC 325 MG EC tablet Take 1 tablet (325 mg total) by mouth daily.    . benzonatate (TESSALON PERLES) 100 MG capsule Take 1 capsule (100 mg total) by mouth 3 (three) times daily as needed for cough. 20 capsule 0  . losartan (COZAAR) 25 MG tablet Take 1 tablet (25 mg total) by mouth daily. 30 tablet 3  . metFORMIN (GLUCOPHAGE-XR) 500 MG 24 hr tablet Take 500 mg by mouth 2 (two) times daily.     . metoprolol tartrate (LOPRESSOR) 25 MG tablet Take 0.5 tablets (12.5 mg total) by mouth 2 (two) times daily. 31 tablet 1  . Multiple Vitamin (MULTIVITAMIN) tablet Take 1 tablet by mouth daily.    . pravastatin (PRAVACHOL) 80  MG tablet Take 80 mg by mouth every evening.      No current facility-administered medications for this visit.       Physical Exam: BP 133/80 mmHg  Pulse 45  Resp 20  Ht 5\' 10"  (1.778 m)  Wt 214 lb (97.07 kg)  BMI 30.71 kg/m2  SpO2 98%  General appearance: alert, cooperative and no distress Heart: regular rate and rhythm Lungs: clear to auscultation bilaterally Extremities: Minor edema Wound: Incisions are healing well. Wounds:  Diagnostic Studies & Laboratory data:         Recent Radiology Findings: Dg Chest 2 View  10/20/2015  CLINICAL DATA:  CABG. EXAM: CHEST  2 VIEW COMPARISON:  09/20/2015 FINDINGS: Prior CABG. Cardiomegaly with normal pulmonary vascularity. Improving left mid lung field and left lower lobe infiltrate. Mild residual noted in the left mid lung. Continued follow-up chest x-rays recommended to demonstrate clearing to exclude underlying mass lesion. No pleural effusion or pneumothorax. IMPRESSION: Mild residual infiltrate left mid lung field, continued follow-up chest x-rays recommended demonstrate complete clearing. Electronically Signed   By: Marcello Moores  Register   On: 10/20/2015 12:11      I have independently reviewed the above radiology findings and reviewed findings  with the patient.  Recent Labs: Lab Results  Component Value Date   WBC 10.9* 10/08/2015   HGB 13.0 10/08/2015   HCT 39.5 10/08/2015   PLT 802* 10/08/2015   GLUCOSE 82 10/08/2015   CHOL 141 09/16/2015   TRIG 117 09/16/2015   HDL 34* 09/16/2015   LDLCALC 84 09/16/2015   ALT 27 09/16/2015   AST 58* 09/16/2015   NA 137 10/08/2015   K 3.9 10/08/2015   CL 100 10/08/2015   CREATININE 1.00 10/08/2015   BUN 15 10/08/2015   CO2 23 10/08/2015   INR 1.43 09/17/2015   HGBA1C 7.3* 09/16/2015      Assessment / Plan:  Excellent postoperative recovery. We discussed activity advancement as well as driving his automobile. He understands instructions. We will see again in 3 months in routine  follow-up. There are no significant cardiac issues other than routine postoperative recovery.        Hulet Ehrmann E 10/20/2015 1:14 PM

## 2015-10-20 NOTE — Patient Instructions (Signed)
The patient was given verbal instructions regarding activity progression and driving progression.

## 2015-10-27 ENCOUNTER — Telehealth: Payer: Self-pay | Admitting: Cardiovascular Disease

## 2015-10-27 NOTE — Telephone Encounter (Signed)
Please call patient with a follow up app/ spoke with scheduler Debra Moore// no current availability.

## 2015-10-27 NOTE — Telephone Encounter (Signed)
New problem   Pt was told by Cecilie Kicks from 10/08/15 appt that Dr Antionette Char nurse would call them back for a 6wk f/u... Please advise pt.

## 2015-10-28 ENCOUNTER — Encounter: Payer: Self-pay | Admitting: Cardiovascular Disease

## 2015-11-04 ENCOUNTER — Telehealth: Payer: Self-pay | Admitting: Cardiovascular Disease

## 2015-11-04 NOTE — Telephone Encounter (Signed)
Left message on cardiac rehab voicemail that they need to fax an order form to our office for Dr Burt Knack to complete.

## 2015-11-04 NOTE — Telephone Encounter (Signed)
New message      Calling to get an order for pt to participate in [facility].  Please fax it to [phone]

## 2015-11-05 ENCOUNTER — Telehealth: Payer: Self-pay | Admitting: Cardiovascular Disease

## 2015-11-05 NOTE — Telephone Encounter (Signed)
Informed patient that Dr. Antionette Char nurse is out of the office today but will call him ASAP for 6-8 week FU OV with Burt Knack (patient was seen 12/28).  Patient grateful for call.

## 2015-11-05 NOTE — Telephone Encounter (Signed)
New Message  Pt called states that she did not get a call back to schedule an appt with Dr. Burt Knack, She states that she was going to be worked in per 12/28 appt with the PA. The note on that visit says that Theodosia Quay, RN, Dr. Antionette Char nurse will be in contact to schedule you an appointment to seen back in 6 weeks. Pt declined appt with PA and/or NP. Please call to assist.

## 2015-11-06 ENCOUNTER — Other Ambulatory Visit: Payer: Self-pay

## 2015-11-06 ENCOUNTER — Encounter: Payer: Self-pay | Admitting: Cardiovascular Disease

## 2015-11-06 MED ORDER — METOPROLOL TARTRATE 25 MG PO TABS: 12.5000 mg | ORAL_TABLET | Freq: Two times a day (BID) | ORAL | Status: DC

## 2015-11-06 NOTE — Telephone Encounter (Signed)
I spoke with the pt and made him aware that I have to wait for Dr Burt Knack to open up office schedules before I can make appointments. Dr Burt Knack will be coming into the office on 11/13/15 and I have scheduled the pt for 1:45.

## 2015-11-06 NOTE — Telephone Encounter (Signed)
I spoke with pt and scheduled him to see Dr Burt Knack 11/13/15.

## 2015-11-06 NOTE — Telephone Encounter (Signed)
I spoke with cardiac rehab and they will fax a formal referral order for Dr Burt Knack to sign.

## 2015-11-13 ENCOUNTER — Encounter: Payer: Self-pay | Admitting: Cardiovascular Disease

## 2015-11-13 ENCOUNTER — Ambulatory Visit (INDEPENDENT_AMBULATORY_CARE_PROVIDER_SITE_OTHER): Payer: Managed Care, Other (non HMO) | Admitting: Cardiovascular Disease

## 2015-11-13 VITALS — BP 130/90 | HR 80 | Ht 70.0 in | Wt 221.0 lb

## 2015-11-13 DIAGNOSIS — I2510 Atherosclerotic heart disease of native coronary artery without angina pectoris: Secondary | ICD-10-CM

## 2015-11-13 DIAGNOSIS — Z951 Presence of aortocoronary bypass graft: Secondary | ICD-10-CM

## 2015-11-13 MED ORDER — METOPROLOL TARTRATE 25 MG PO TABS: 25.0000 mg | ORAL_TABLET | Freq: Two times a day (BID) | ORAL | Status: AC

## 2015-11-13 MED ORDER — LOSARTAN POTASSIUM 50 MG PO TABS: 50.0000 mg | ORAL_TABLET | Freq: Every day | ORAL | Status: DC

## 2015-11-13 NOTE — Progress Notes (Signed)
Cardiology Office Note Date:  11/13/2015   ID:  ZEREK MCCAMISH, DOB 04-27-1956, MRN [id number]  PCP:  Marylene Land, MD  Cardiologist:  Sherren Mocha, MD    Chief Complaint  Patient presents with  . Coronary Artery Disease   History of Present Illness: Timothy Gentry is a 60 y.o. male who presents for  Follow-up after presenting with non-ST elevation infarction in December 2016. Cardiac catheterization demonstrated severe multivessel coronary artery disease and he underwent coronary bypass surgery 09/17/2015. The patient had an uncomplicated postoperative course. He presents today for follow-up evaluation.  He is doing quite well. He denies chest pain, chest pressure or shortness of breath. He has participating in cardiac rehabilitation. He feels good and has no exertional symptoms. He does complain of a nonproductive cough and questions whether his beta blocker is contributing to this. He's had a cough ever since he was discharged from the hospital. He is tolerating medicines well , but states that his blood pressure has been trending upward.   Past Medical History  Diagnosis Date  . Hematuria   . Diabetes mellitus without complication ([place])     Type II  . Hyperlipidemia   . Hypertension   . Colon polyps   . Degenerative disc disease, cervical   . Chronic kidney disease     Chronic, Stage III  . Colon ulcer     Possible NSAID induced  . Cancer ([place])     skin  . NSTEMI (non-ST elevated myocardial infarction) ([place]) 09/15/2015  . Coronary artery disease 09/15/2015  . 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 Revision total hip arthroplasty  2010  . Colonoscopy  09/18/2009  . Knee surgery Right 1983    Open repair of meniscus of knee  . Lumbar laminectomy  2001  . Lumbar fusion  2002  . Colonoscopy N/A 09/24/2014     Procedure: COLONOSCOPY;  Surgeon: Mayme Genta, MD;  Location: [facility];  Service: Endoscopy;  Laterality: N/A;  . Esophagogastroduodenoscopy N/A 09/24/2014    Procedure: ESOPHAGOGASTRODUODENOSCOPY (EGD);  Surgeon: Mayme Genta, MD;  Location: [facility];  Service: Endoscopy;  Laterality: N/A;  . Cardiac catheterization N/A 09/15/2015    Procedure: Left Heart Cath and Coronary Angiography;  Surgeon: Peter M Martinique, MD;  Location: [facility];  Service: Cardiovascular;  Laterality: N/A;  . Distal pancreatectomy      benign neuroendocrine tumor  . Splenectomy    . Coronary artery bypass graft N/A 09/17/2015    Procedure: CORONARY ARTERY BYPASS GRAFTING (CABG);  Surgeon: Rexene Alberts, MD;  Location: [facility];  Service: Open Heart Surgery;  Laterality: N/A;  . Tee without cardioversion N/A 09/17/2015    Procedure: TRANSESOPHAGEAL ECHOCARDIOGRAM (TEE);  Surgeon: Rexene Alberts, MD;  Location: [facility];  Service: Open Heart Surgery;  Laterality: N/A;    Current Outpatient Prescriptions  Medication Sig Dispense Refill  . allopurinol (ZYLOPRIM) 300 MG tablet Take 150-300 mg by mouth 2 (two) times daily. 150 mg in the morning and 300 mg in the evening.    Marland Kitchen aspirin EC 325 MG EC tablet Take 1 tablet (325 mg total) by mouth daily.    Marland Kitchen losartan (COZAAR) 50 MG tablet Take 1 tablet (50 mg total) by mouth daily.  30 tablet 6  . metFORMIN (GLUCOPHAGE-XR) 500 MG 24 hr tablet Take 500 mg by mouth 2 (two) times daily.     . metoprolol tartrate (LOPRESSOR) 25 MG tablet Take 1 tablet (25 mg total) by mouth 2 (two) times daily. 60 tablet 6  . Multiple Vitamin (MULTIVITAMIN) tablet Take 1 tablet by mouth daily.    . pravastatin (PRAVACHOL) 80 MG tablet Take 80 mg by mouth every evening.      No current facility-administered medications for this visit.    Allergies:   Celebrex   Social History:  The patient  reports that he quit smoking about 36 years ago. His smoking use included  Cigarettes. He does not have any smokeless tobacco history on file. He reports that he drinks alcohol. He reports that he does not use illicit drugs.   Family History:  The patient's family history includes Cancer in his father.    ROS:  Please see the history of present illness.  Otherwise, review of systems is positive for cough.  All other systems are reviewed and negative.    PHYSICAL EXAM: VS:  BP 130/90 mmHg  Pulse 80  Ht 5\' 10"  (1.778 m)  Wt 100.245 kg (221 lb)  BMI 31.71 kg/m2 , BMI Body mass index is 31.71 kg/(m^2). GEN: Well nourished, well developed, in no acute distress HEENT: normal Neck: no JVD, no masses. No carotid bruits Cardiac: RRR without murmur or gallop                Respiratory:  clear to auscultation bilaterally, normal work of breathing GI: soft, nontender, nondistended, + BS MS: no deformity or atrophy Ext: no pretibial edema, pedal pulses 2+= bilaterally Skin: warm and dry, no rash Neuro:  Strength and sensation are intact Psych: euthymic mood, full affect  EKG:  EKG is not ordered today.  Recent Labs: 09/16/2015: ALT 27 09/18/2015: Magnesium 2.5* 10/08/2015: BUN 15; Creat 1.00; Hemoglobin 13.0; Platelets 802*; Potassium 3.9; Sodium 137   Lipid Panel     Component Value Date/Time   CHOL 141 09/16/2015 0245   TRIG 117 09/16/2015 0245   HDL 34* 09/16/2015 0245   CHOLHDL 4.1 09/16/2015 0245   VLDL 23 09/16/2015 0245   LDLCALC 84 09/16/2015 0245      Wt Readings from Last 3 Encounters:  11/13/15 100.245 kg (221 lb)  10/20/15 97.07 kg (214 lb)  10/08/15 98.612 kg (217 lb 6.4 oz)     Cardiac Studies Reviewed: 2D Echo 09/16/2015: Study Conclusions  - Left ventricle: Small area of posterior lateral hypokinesis The cavity size was normal. Wall thickness was increased in a pattern of mild LVH. Systolic function was normal. The estimated ejection fraction was in the range of 50% to 55%. Wall motion was normal; there were no regional  wall motion abnormalities. Left ventricular diastolic function parameters were normal. - Impressions: Abnormal GLS -14.5 regonally worse at the base  ASSESSMENT AND PLAN: 1.  CAD, native vessel, now s/p multivessel CABG:  The patient is doing quite well. He is on a good medical program that includes a beta blocker, ARB , and aspirin. He has preserved LV systolic function. The patient has no symptoms of recurrent angina or congestive heart failure. He seems to be progressing very well and I don't think he requires any further restrictions. He will return for PA follow-up in 3 months and I'll plan to see him back in 6 months.  2. Essential hypertension, uncontrolled: I recommended that he increase losartan to  50 mg daily and metoprolol 25 mg twice daily. We'll continue to monitor blood pressure.  3. Hyperlipidemia: The patient is treated with pravastatin 80 mg.  Lipids are followed by Dr. Sandi Mariscal.   Current medicines are reviewed with the patient today.  The patient does not have concerns regarding medicines.  Labs/ tests ordered today include:  No orders of the defined types were placed in this encounter.    Disposition:   FU as detailed above  Signed, Sherren Mocha, MD  11/13/2015 5:44 PM    [facility] [place], [place],   [address] Phone: [phone]; Fax: [phone]

## 2015-11-13 NOTE — Telephone Encounter (Signed)
I did not receive a faxed order from cardiac rehab.  The pt was given a hand written order during his 11/13/15 office visit to give to cardiac rehab.

## 2015-11-13 NOTE — Patient Instructions (Signed)
Medication Instructions:  Your physician has recommended you make the following change in your medication:  1. INCREASE Losartan to 50mg  take one tablet by mouth daily 2. INCREASE Metoprolol Tartrate to 25mg  take one tablet by mouth twice a day  Labwork: No new orders.   Testing/Procedures: No new orders.   Follow-Up: Your physician recommends that you schedule a follow-up appointment in: 3 MONTHS with PA/NP  Your physician wants you to follow-up in: 6 MONTHS with Dr Burt Knack.  You will receive a reminder letter in the mail two months in advance. If you don't receive a letter, please call our office to schedule the follow-up appointment.   Any Other Special Instructions Will Be Listed Below (If Applicable).     If you need a refill on your cardiac medications before your next appointment, please call your pharmacy.

## 2016-01-12 ENCOUNTER — Encounter: Payer: Managed Care, Other (non HMO) | Admitting: Thoracic Surgery (Cardiothoracic Vascular Surgery)

## 2016-01-25 IMAGING — CR DG ABDOMEN ACUTE W/ 1V CHEST
4 series · 4 of 4 positions shown · non-contrast
Comparison: Pancreatic MRI 03/22/2014; CT abdomen/ pelvis
03/20/2014, prior chest x-ray 06/05/2007

CLINICAL DATA: 58-year-old male with bright red blood per rectum

EXAM:
ACUTE ABDOMEN SERIES (ABDOMEN 2 VIEW & CHEST 1 VIEW)

[w chest pa]
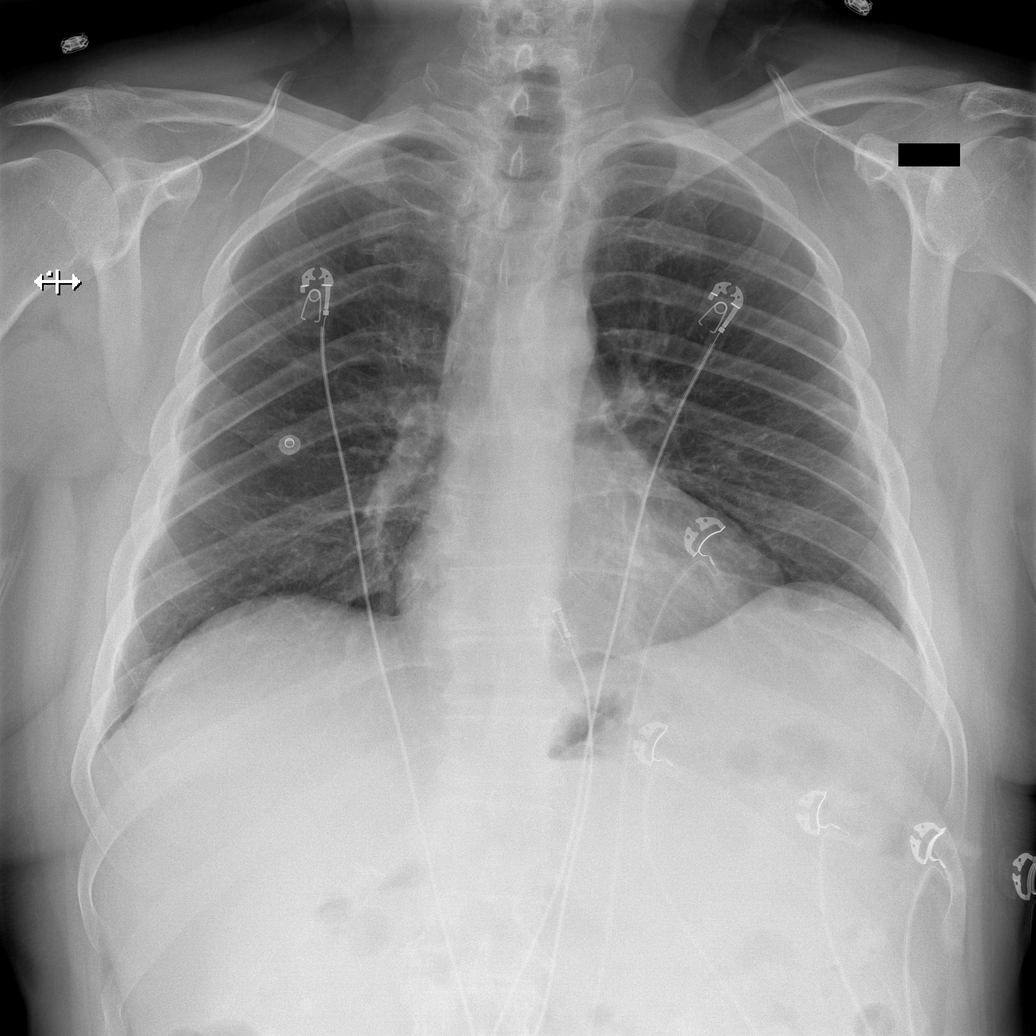

[w abdomen upright]
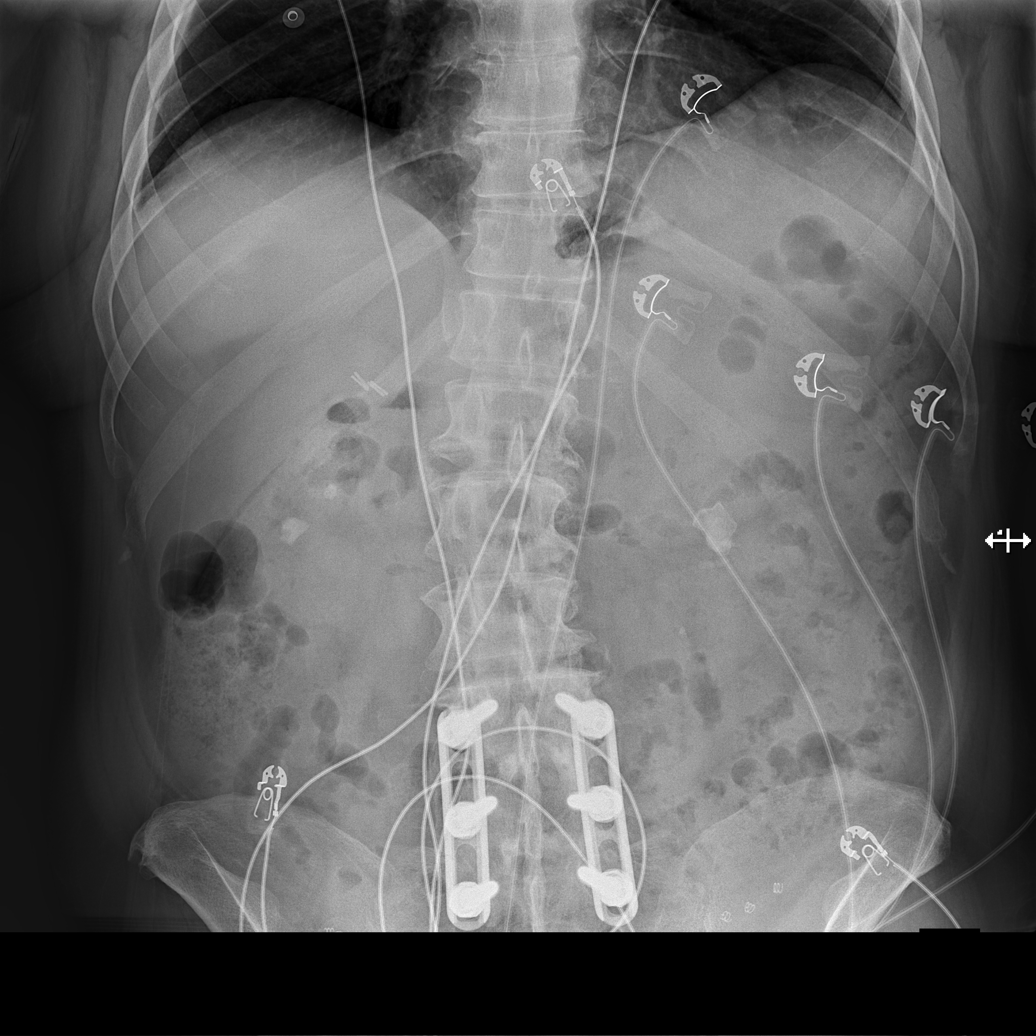

[t abdomen supine (1 of 2)]
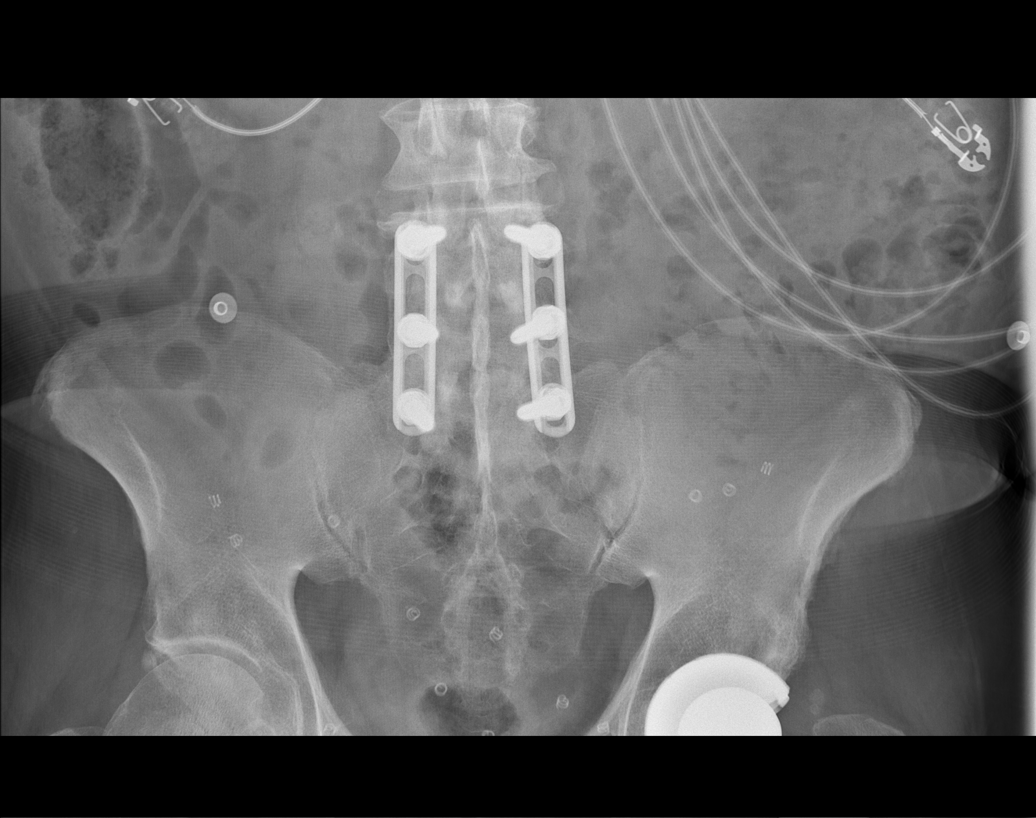

[t abdomen supine (2 of 2)]
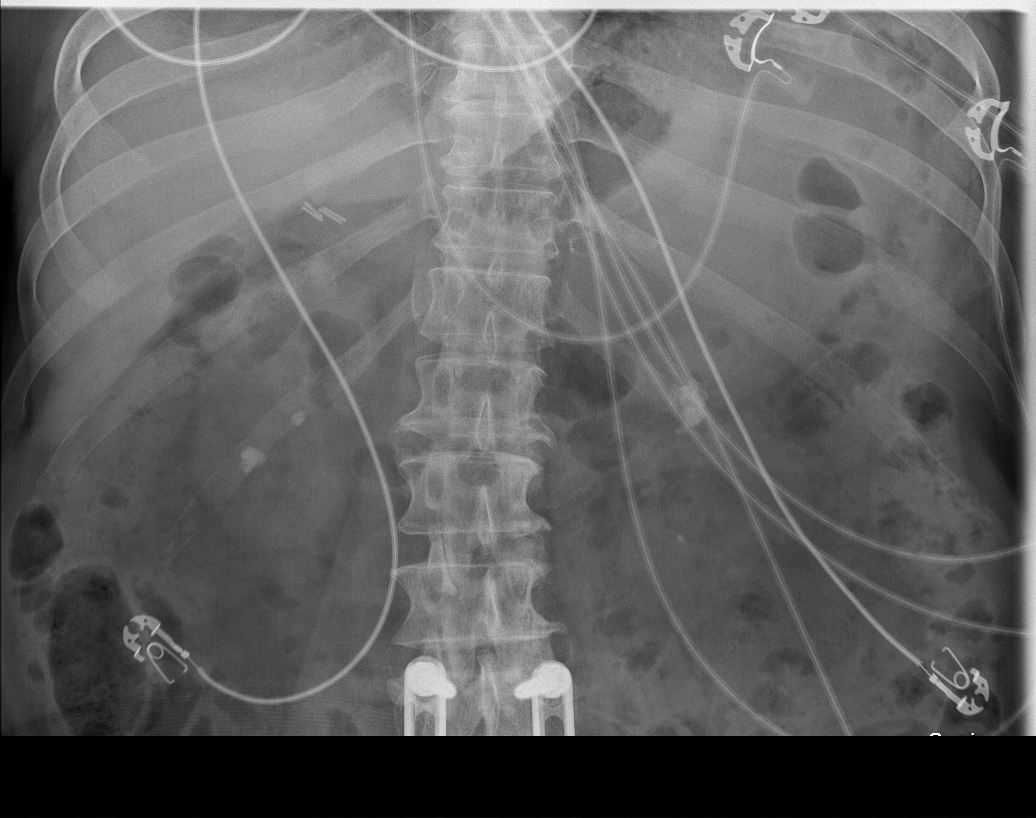

[4 of 4 positions shown; findings below may reference images not displayed]

FINDINGS: The lungs are clear and negative for focal airspace consolidation,
pulmonary edema or suspicious pulmonary nodule. No pleural effusion
or pneumothorax. Cardiac and mediastinal contours are within normal
limits. No acute fracture or lytic or blastic osseous lesions. The
visualized upper abdominal bowel gas pattern is unremarkable.

No free air on the upright view. The bowel gas pattern is
nonobstructive. Gas is noted throughout the colon. Multiple radio
opacities project over the bilateral renal shadows consistent with
nephrolithiasis. On the right, there are at least 2 stones including
a 6 mm stone in the interpolar collecting system and a 13 mm stone
in the lower pole. On the left, there is a 21 mm partial staghorn
calculus in the region of the upper pole infundibulum. Smaller
stones are noted just inferior to this. Incompletely evaluated
posterior lumbar fixation hardware extending from L4 through S1.
Normal bony mineralization. Surgical clips in the right upper
quadrant suggest prior cholecystectomy. Laparoscopic hernia tacks
noted overlying the lower abdomen. Incompletely imaged left hip
prosthesis.
IMPRESSION: 1. Negative chest x-ray.
2. No evidence of obstruction or free air.
3. Bilateral nephrolithiasis.

## 2016-01-26 ENCOUNTER — Encounter: Payer: Managed Care, Other (non HMO) | Admitting: Thoracic Surgery (Cardiothoracic Vascular Surgery)

## 2016-01-27 ENCOUNTER — Encounter: Payer: Self-pay | Admitting: Thoracic Surgery (Cardiothoracic Vascular Surgery)

## 2016-01-27 ENCOUNTER — Ambulatory Visit (INDEPENDENT_AMBULATORY_CARE_PROVIDER_SITE_OTHER): Payer: Managed Care, Other (non HMO) | Admitting: Thoracic Surgery (Cardiothoracic Vascular Surgery)

## 2016-01-27 VITALS — BP 159/87 | HR 40 | Resp 20 | Ht 70.0 in | Wt 242.0 lb

## 2016-01-27 DIAGNOSIS — Z951 Presence of aortocoronary bypass graft: Secondary | ICD-10-CM

## 2016-01-27 NOTE — Progress Notes (Signed)
[address]       [place] [address]             [phone]     CARDIOTHORACIC SURGERY OFFICE NOTE  Referring Provider is Sherren Mocha, MD PCP is Marylene Land, MD   HPI:  Patient returns to the office today for routine follow-up status post coronary artery bypass grafting 4 on 09/17/2015 for severe three-vessel coronary artery disease status post acute non-ST segment elevation myocardial infarction. The patient's early postoperative recovery was uncomplicated and he was discharged from the hospital on the fourth postoperative day. He was last seen in follow-up here in our office on 10/20/2015 at which time he was doing well. Since then he has continued to do very well and he returns to our office for routine follow-up. He has just completed the outpatient cardiac rehabilitation program. He feels as though he has completely recovered from the surgery with minimal residual soreness in his anterior chest. He states that his exercise tolerance is quite good and in fact the patient claims that he "is in better shape than he has been for probably 20 years". He denies any symptoms of exertional chest pain or chest tightness. He is not having shortness of breath. He states that his diabetes has been under good control. He is scheduled for routine follow-up appointment with Dr. Burt Knack next month.   Current Outpatient Prescriptions  Medication Sig Dispense Refill  . aspirin EC 325 MG EC tablet Take 1 tablet (325 mg total) by mouth daily.    Marland Kitchen losartan (COZAAR) 50 MG tablet Take 1 tablet (50 mg total) by mouth daily. 30 tablet 6  . metFORMIN (GLUCOPHAGE-XR) 500 MG 24 hr tablet Take 500 mg by mouth 2 (two) times daily.     . metoprolol tartrate (LOPRESSOR) 25 MG tablet Take 1 tablet (25 mg total) by mouth 2 (two) times daily. 60 tablet 6  . Multiple Vitamin (MULTIVITAMIN) tablet Take 1 tablet by mouth daily.    . pravastatin (PRAVACHOL) 80 MG tablet Take 80 mg by mouth  every evening.      No current facility-administered medications for this visit.      Physical Exam:   BP 159/87 mmHg  Pulse 40  Resp 20  Ht 5\' 10"  (1.778 m)  Wt 242 lb (109.77 kg)  BMI 34.72 kg/m2  SpO2 99%  General:  Well-appearing  Chest:   Clear to auscultation  CV:   Regular rate and rhythm without murmur  Incisions:  Completely healed, sternum is stable  Abdomen:  Soft and nontender  Extremities:  Warm and well-perfused  Diagnostic Tests:  n/a   Impression:  Patient is doing very well more than 4 months status post coronary artery bypass grafting 4.  Plan:  The patient may resume unrestricted physical activity. He has been reminded regarding the lifelong benefits of regular exercise and a heart healthy diet. The importance of very close attention to long-term management of the patient's diabetes and hyperlipidemia has been emphasized. All of his questions have been addressed. He will continue to follow-up with Dr. Burt Knack and Dr. Sandi Mariscal.  He will return to our office next December for routine follow-up, a proximally 1 year following his surgery.  I spent in excess of 10 minutes during the conduct of this office consultation and >50% of this time involved direct face-to-face encounter with the patient for counseling and/or coordination of their care.   Valentina Gu. Roxy Manns, MD 01/27/2016 4:12 PM

## 2016-01-27 NOTE — Patient Instructions (Signed)
You may resume unrestricted physical activity without any particular limitations at this time.  Make every effort to stay physically active, get some type of exercise on a regular basis, and stick to a "heart healthy diet".  The long term benefits for regular exercise and a healthy diet are critically important to your overall health and wellbeing.  Make every effort to keep your diabetes under very tight control.  Follow up closely with your primary care physician or endocrinologist and strive to keep their hemoglobin A1c levels as low as possible, preferably near or below 6.0.  The long term benefits of strict control of diabetes are far reaching and critically important for your overall health and survival.

## 2016-02-10 ENCOUNTER — Encounter: Payer: Self-pay | Admitting: Nurse Practitioner

## 2016-02-10 ENCOUNTER — Ambulatory Visit (INDEPENDENT_AMBULATORY_CARE_PROVIDER_SITE_OTHER): Payer: Managed Care, Other (non HMO) | Admitting: Nurse Practitioner

## 2016-02-10 VITALS — BP 160/84 | HR 60 | Ht 70.0 in | Wt 218.1 lb

## 2016-02-10 DIAGNOSIS — E785 Hyperlipidemia, unspecified: Secondary | ICD-10-CM | POA: Diagnosis not present

## 2016-02-10 DIAGNOSIS — I2510 Atherosclerotic heart disease of native coronary artery without angina pectoris: Secondary | ICD-10-CM | POA: Diagnosis not present

## 2016-02-10 DIAGNOSIS — I10 Essential (primary) hypertension: Secondary | ICD-10-CM | POA: Diagnosis not present

## 2016-02-10 NOTE — Progress Notes (Signed)
CARDIOLOGY OFFICE NOTE  Date:  02/10/2016    Dianne Dun Date of Birth: Feb 21, 1956 Medical Record [id number]  PCP:  Marylene Land, MD  Cardiologist:  Burt Knack    Chief Complaint  Patient presents with  . Coronary Artery Disease  . Hyperlipidemia  . Hypertension    3 month check - seen for Dr. Burt Knack    History of Present Illness: Timothy Gentry is a 60 y.o. male who presents today for a 3 month check - seen for Dr. Burt Knack.   He has a history of CAD with NSTEMI and subsequent  CABG x 4 in December 2016. The patient had an uncomplicated postoperative course. Other issues include DM, HLD, HTN and CKD.   Last seen back in February. BP creeping up but otherwise was doing ok. Meds were adjusted.   Comes back today. Here alone. He is doing well. He is very happy with how he feels. No chest pain. Not short of breath. Walking 2 miles a day. Trying to eat better. BP better at home - typically below 140/90. Not dizzy or lightheaded. Tolerating his medicines. Has his physical later this summer with his labs.   Past Medical History  Diagnosis Date  . Hematuria   . Diabetes mellitus without complication ([place])     Type II  . Hyperlipidemia   . Hypertension   . Colon polyps   . Degenerative disc disease, cervical   . Chronic kidney disease     Chronic, Stage III  . Colon ulcer     Possible NSAID induced  . Cancer ([place])     skin  . NSTEMI (non-ST elevated myocardial infarction) ([place]) 09/15/2015  . Coronary artery disease 09/15/2015  . 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 Revision total hip arthroplasty  2010  . Colonoscopy  09/18/2009  . Knee surgery Right 1983    Open repair of meniscus of knee  . Lumbar laminectomy  2001  . Lumbar fusion  2002  . Colonoscopy N/A 09/24/2014    Procedure: COLONOSCOPY;   Surgeon: Mayme Genta, MD;  Location: [facility];  Service: Endoscopy;  Laterality: N/A;  . Esophagogastroduodenoscopy N/A 09/24/2014    Procedure: ESOPHAGOGASTRODUODENOSCOPY (EGD);  Surgeon: Mayme Genta, MD;  Location: [facility];  Service: Endoscopy;  Laterality: N/A;  . Cardiac catheterization N/A 09/15/2015    Procedure: Left Heart Cath and Coronary Angiography;  Surgeon: Peter M Martinique, MD;  Location: [facility];  Service: Cardiovascular;  Laterality: N/A;  . Distal pancreatectomy      benign neuroendocrine tumor  . Splenectomy    . Coronary artery bypass graft N/A 09/17/2015    Procedure: CORONARY ARTERY BYPASS GRAFTING (CABG);  Surgeon: Rexene Alberts, MD;  Location: [facility];  Service: Open Heart Surgery;  Laterality: N/A;  . Tee without cardioversion N/A 09/17/2015    Procedure: TRANSESOPHAGEAL ECHOCARDIOGRAM (TEE);  Surgeon: Rexene Alberts, MD;  Location: [facility];  Service: Open Heart Surgery;  Laterality: N/A;     Medications: Current Outpatient Prescriptions  Medication Sig Dispense Refill  . aspirin EC 325 MG EC tablet Take 1 tablet (325 mg total) by mouth daily.    Marland Kitchen losartan (COZAAR) 50 MG tablet Take 1 tablet (50 mg total) by mouth  daily. 30 tablet 6  . metFORMIN (GLUCOPHAGE-XR) 500 MG 24 hr tablet Take 500 mg by mouth 2 (two) times daily.     . metoprolol tartrate (LOPRESSOR) 25 MG tablet Take 1 tablet (25 mg total) by mouth 2 (two) times daily. 60 tablet 6  . Multiple Vitamin (MULTIVITAMIN) tablet Take 1 tablet by mouth daily.    . pravastatin (PRAVACHOL) 80 MG tablet Take 80 mg by mouth every evening.      No current facility-administered medications for this visit.    Allergies: Allergies  Allergen Reactions  . Celebrex [Celecoxib] Other (See Comments)    Patient passed out    Social History: The patient  reports that he quit smoking about 36 years ago. His smoking use included Cigarettes. He does not have any smokeless tobacco history on file.  He reports that he drinks alcohol. He reports that he does not use illicit drugs.   Family History: The patient's family history includes Cancer in his father.   Review of Systems: Please see the history of present illness.   Otherwise, the review of systems is positive for none.   All other systems are reviewed and negative.   Physical Exam: VS:  BP 160/84 mmHg  Pulse 60  Ht 5\' 10"  (1.778 m)  Wt 218 lb 1.9 oz (98.939 kg)  BMI 31.30 kg/m2 .  BMI Body mass index is 31.3 kg/(m^2).  Wt Readings from Last 3 Encounters:  02/10/16 218 lb 1.9 oz (98.939 kg)  01/27/16 242 lb (109.77 kg)  11/13/15 221 lb (100.245 kg)   BP by me is 150/80.  General: Pleasant. Well developed, well nourished and in no acute distress.  HEENT: Normal. Neck: Supple, no JVD, carotid bruits, or masses noted.  Cardiac: Regular rate and rhythm. No murmurs, rubs, or gallops. No edema.  Respiratory:  Lungs are clear to auscultation bilaterally with normal work of breathing.  GI: Soft and nontender.  MS: No deformity or atrophy. Gait and ROM intact. Skin: Warm and dry. Color is normal.  Neuro:  Strength and sensation are intact and no gross focal deficits noted.  Psych: Alert, appropriate and with normal affect.   LABORATORY DATA:  EKG:  EKG is not ordered today.  Lab Results  Component Value Date   WBC 10.9* 10/08/2015   HGB 13.0 10/08/2015   HCT 39.5 10/08/2015   PLT 802* 10/08/2015   GLUCOSE 82 10/08/2015   CHOL 141 09/16/2015   TRIG 117 09/16/2015   HDL 34* 09/16/2015   LDLCALC 84 09/16/2015   ALT 27 09/16/2015   AST 58* 09/16/2015   NA 137 10/08/2015   K 3.9 10/08/2015   CL 100 10/08/2015   CREATININE 1.00 10/08/2015   BUN 15 10/08/2015   CO2 23 10/08/2015   INR 1.43 09/17/2015   HGBA1C 7.3* 09/16/2015    BNP (last 3 results) No results for input(s): BNP in the last 8760 hours.  ProBNP (last 3 results) No results for input(s): PROBNP in the last 8760 hours.   Other Studies Reviewed  Today:  2D Echo 09/16/2015: Study Conclusions  - Left ventricle: Small area of posterior lateral hypokinesis The cavity size was normal. Wall thickness was increased in a pattern of mild LVH. Systolic function was normal. The estimated ejection fraction was in the range of 50% to 55%. Wall motion was normal; there were no regional wall motion abnormalities. Left ventricular diastolic function parameters were normal. - Impressions: Abnormal GLS -14.5 regonally worse at the base  ASSESSMENT  AND PLAN:  1. CAD, native vessel, now s/p multivessel CABG: Doing well clinically. Continue with CV risk factor modification.   2. Essential hypertension - Better control at home. Would continue with current regimen and he will continue to monitor.   3. Hyperlipidemia: on statin therapy - labs by PCP - seeing later this summer.   4. DM  Current medicines are reviewed with the patient today.  The patient does not have concerns regarding medicines other than what has been noted above.  The following changes have been made:  See above.  Labs/ tests ordered today include:   No orders of the defined types were placed in this encounter.     Disposition:   FU with Dr. Burt Knack in 3  months.   Patient is agreeable to this plan and will call if any problems develop in the interim.   Signed: Burtis Junes, RN, ANP-C 02/10/2016 9:51 AM  [facility] [address] [place], [place]  [address] Phone: [phone] Fax: [phone]

## 2016-02-10 NOTE — Patient Instructions (Addendum)
We will be checking the following labs today - NONE   Medication Instructions:    Continue with your current medicines.     Testing/Procedures To Be Arranged:  N/A  Follow-Up:   See Dr. Burt Knack in 3 months    Other Special Instructions:   Keep up the good work  BP goal is <140/90 most of the time - continue to monitor    If you need a refill on your cardiac medications before your next appointment, please call your pharmacy.   Call the [place] office at [phone] if you have any questions, problems or concerns.

## 2016-03-01 ENCOUNTER — Telehealth: Payer: Self-pay | Admitting: Cardiovascular Disease

## 2016-03-01 NOTE — Telephone Encounter (Signed)
New message     The Dr. Gabriel Carina for Dr. [facility]ull[facility] would like the January 2017 lab work faxed to them [phone]

## 2016-03-09 ENCOUNTER — Other Ambulatory Visit: Payer: Self-pay | Admitting: Family Medicine

## 2016-03-09 ENCOUNTER — Ambulatory Visit
Admission: RE | Admit: 2016-03-09 | Discharge: 2016-03-09 | Disposition: A | Discharge status: Home / Self Care | Payer: Managed Care, Other (non HMO) | Source: Ambulatory Visit | Attending: Family Medicine | Admitting: Family Medicine

## 2016-03-09 DIAGNOSIS — M545 Low back pain: Secondary | ICD-10-CM

## 2016-03-10 ENCOUNTER — Other Ambulatory Visit: Payer: Self-pay | Admitting: Family Medicine

## 2016-03-10 DIAGNOSIS — M545 Low back pain: Secondary | ICD-10-CM

## 2016-03-13 ENCOUNTER — Other Ambulatory Visit: Payer: Self-pay | Admitting: Family Medicine

## 2016-03-13 DIAGNOSIS — M545 Low back pain, unspecified: Secondary | ICD-10-CM

## 2016-03-14 ENCOUNTER — Ambulatory Visit
Admission: RE | Admit: 2016-03-14 | Discharge: 2016-03-14 | Disposition: A | Discharge status: Home / Self Care | Payer: Managed Care, Other (non HMO) | Source: Ambulatory Visit | Attending: Family Medicine | Admitting: Family Medicine

## 2016-03-14 ENCOUNTER — Other Ambulatory Visit: Payer: Managed Care, Other (non HMO)

## 2016-03-14 DIAGNOSIS — M545 Low back pain, unspecified: Secondary | ICD-10-CM

## 2016-05-18 DIAGNOSIS — C419 Malignant neoplasm of bone and articular cartilage, unspecified: Secondary | ICD-10-CM | POA: Diagnosis not present

## 2016-05-18 DIAGNOSIS — C7800 Secondary malignant neoplasm of unspecified lung: Secondary | ICD-10-CM | POA: Diagnosis not present

## 2016-05-18 DIAGNOSIS — D649 Anemia, unspecified: Secondary | ICD-10-CM | POA: Diagnosis not present

## 2016-06-16 DIAGNOSIS — D509 Iron deficiency anemia, unspecified: Secondary | ICD-10-CM | POA: Diagnosis not present

## 2016-06-16 DIAGNOSIS — Z86718 Personal history of other venous thrombosis and embolism: Secondary | ICD-10-CM

## 2016-06-16 DIAGNOSIS — Z7901 Long term (current) use of anticoagulants: Secondary | ICD-10-CM

## 2016-06-16 DIAGNOSIS — C7800 Secondary malignant neoplasm of unspecified lung: Secondary | ICD-10-CM | POA: Diagnosis not present

## 2016-06-16 DIAGNOSIS — N329 Bladder disorder, unspecified: Secondary | ICD-10-CM

## 2016-06-16 DIAGNOSIS — D473 Essential (hemorrhagic) thrombocythemia: Secondary | ICD-10-CM

## 2016-06-16 DIAGNOSIS — C419 Malignant neoplasm of bone and articular cartilage, unspecified: Secondary | ICD-10-CM | POA: Diagnosis not present

## 2016-06-16 DIAGNOSIS — C7982 Secondary malignant neoplasm of genital organs: Secondary | ICD-10-CM | POA: Diagnosis not present

## 2016-06-18 ENCOUNTER — Ambulatory Visit: Payer: Managed Care, Other (non HMO) | Admitting: Cardiovascular Disease

## 2016-08-12 DIAGNOSIS — Z7901 Long term (current) use of anticoagulants: Secondary | ICD-10-CM

## 2016-08-12 DIAGNOSIS — Z86718 Personal history of other venous thrombosis and embolism: Secondary | ICD-10-CM

## 2016-08-12 DIAGNOSIS — C7800 Secondary malignant neoplasm of unspecified lung: Secondary | ICD-10-CM | POA: Diagnosis not present

## 2016-08-12 DIAGNOSIS — C419 Malignant neoplasm of bone and articular cartilage, unspecified: Secondary | ICD-10-CM | POA: Diagnosis not present

## 2016-08-12 DIAGNOSIS — D473 Essential (hemorrhagic) thrombocythemia: Secondary | ICD-10-CM

## 2016-08-12 DIAGNOSIS — D509 Iron deficiency anemia, unspecified: Secondary | ICD-10-CM | POA: Diagnosis not present

## 2016-09-27 ENCOUNTER — Encounter: Payer: Managed Care, Other (non HMO) | Admitting: Thoracic Surgery (Cardiothoracic Vascular Surgery)

## 2016-11-08 DIAGNOSIS — Z86718 Personal history of other venous thrombosis and embolism: Secondary | ICD-10-CM

## 2016-11-08 DIAGNOSIS — C419 Malignant neoplasm of bone and articular cartilage, unspecified: Secondary | ICD-10-CM | POA: Diagnosis not present

## 2016-11-08 DIAGNOSIS — C7800 Secondary malignant neoplasm of unspecified lung: Secondary | ICD-10-CM | POA: Diagnosis not present

## 2016-11-08 DIAGNOSIS — I10 Essential (primary) hypertension: Secondary | ICD-10-CM | POA: Diagnosis not present

## 2016-11-08 DIAGNOSIS — Z7901 Long term (current) use of anticoagulants: Secondary | ICD-10-CM

## 2016-11-08 DIAGNOSIS — C7989 Secondary malignant neoplasm of other specified sites: Secondary | ICD-10-CM | POA: Diagnosis not present

## 2016-12-06 DIAGNOSIS — C7800 Secondary malignant neoplasm of unspecified lung: Secondary | ICD-10-CM | POA: Diagnosis not present

## 2016-12-06 DIAGNOSIS — C7989 Secondary malignant neoplasm of other specified sites: Secondary | ICD-10-CM | POA: Diagnosis not present

## 2016-12-06 DIAGNOSIS — C419 Malignant neoplasm of bone and articular cartilage, unspecified: Secondary | ICD-10-CM | POA: Diagnosis not present

## 2017-01-17 IMAGING — DX DG CHEST 2V
2 series · 2 of 2 positions shown · non-contrast
Comparison: 09/22/2014

CLINICAL DATA: Chest pain for 2 days.  Right colon block CT.

EXAM:
CHEST  2 VIEW

[w chest pa]
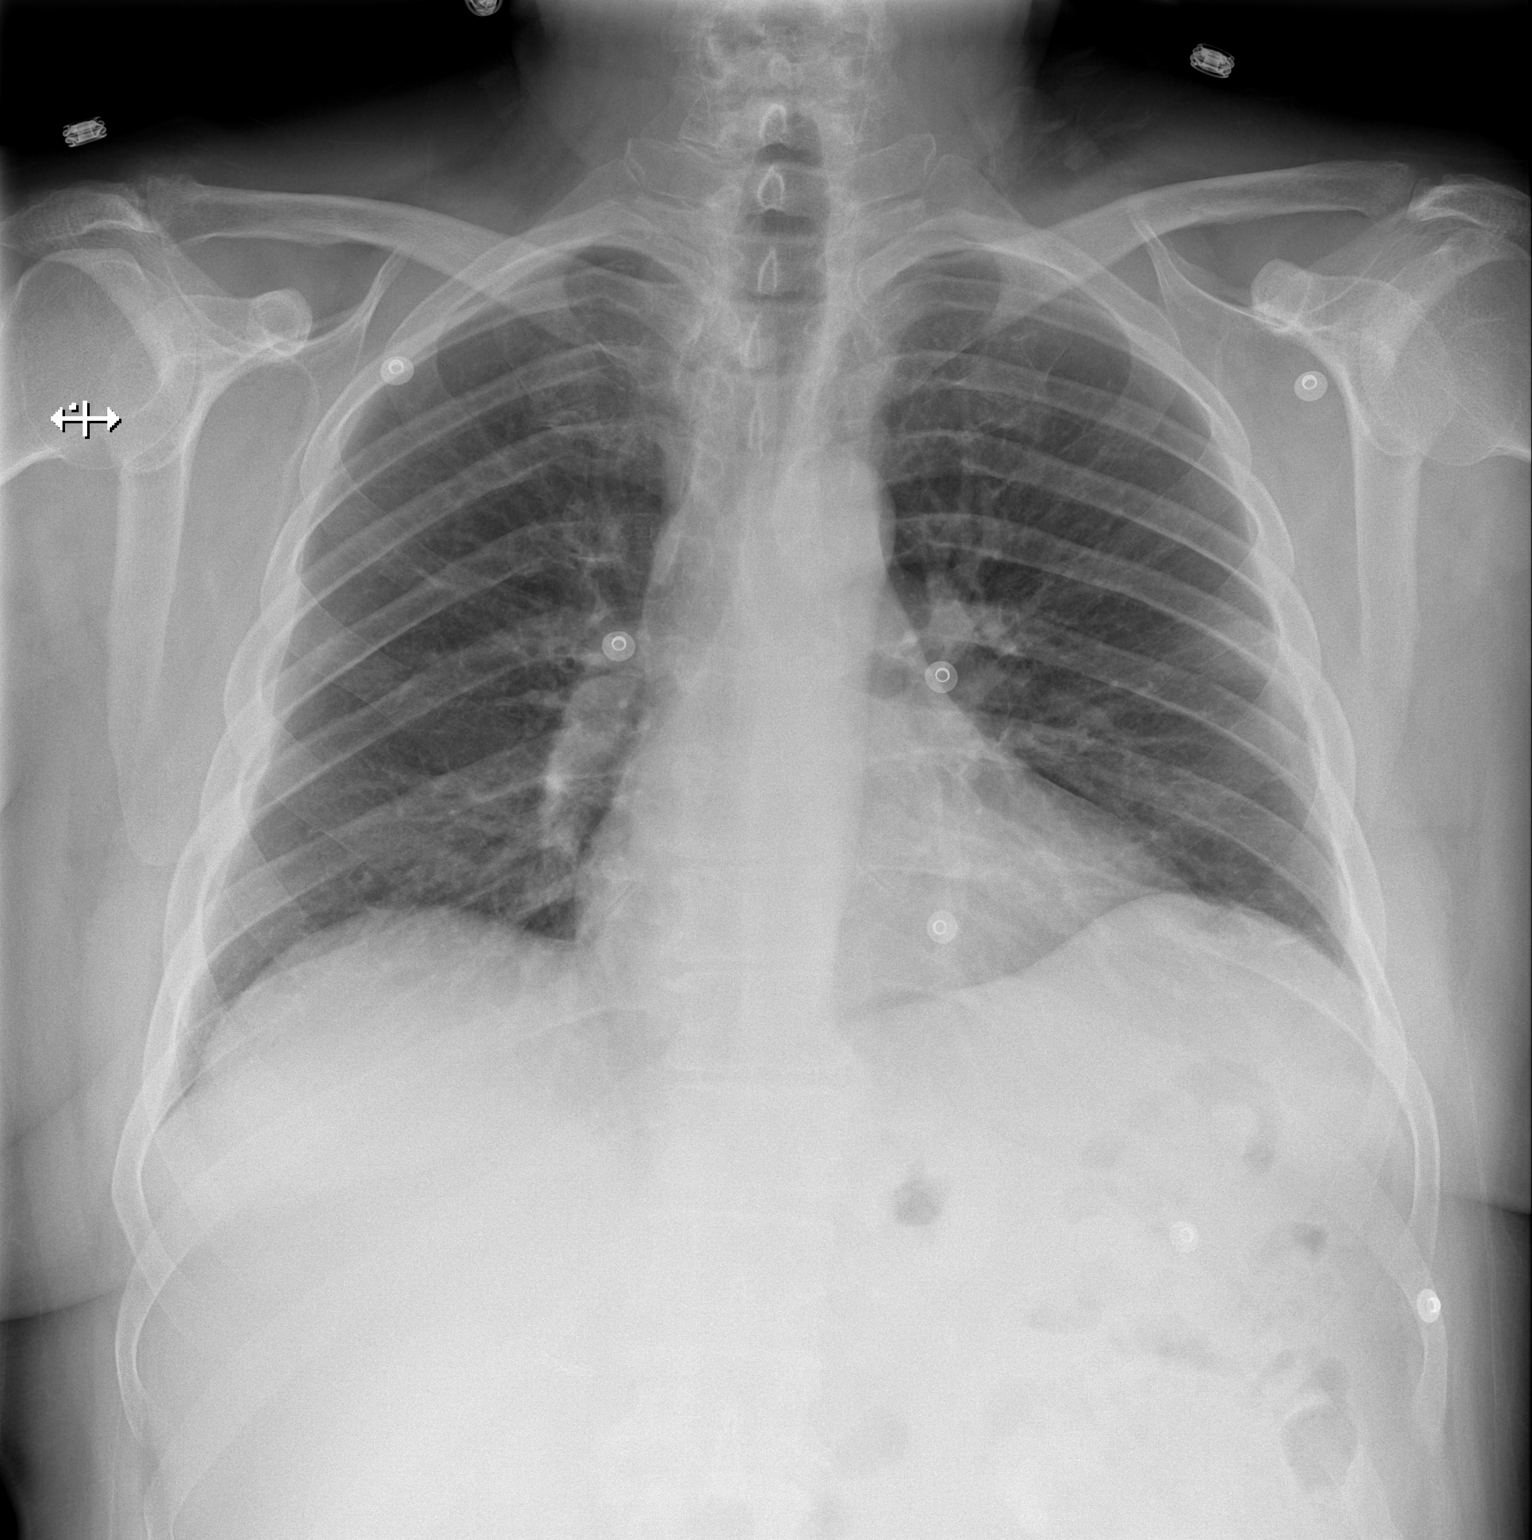

[w chest lat]
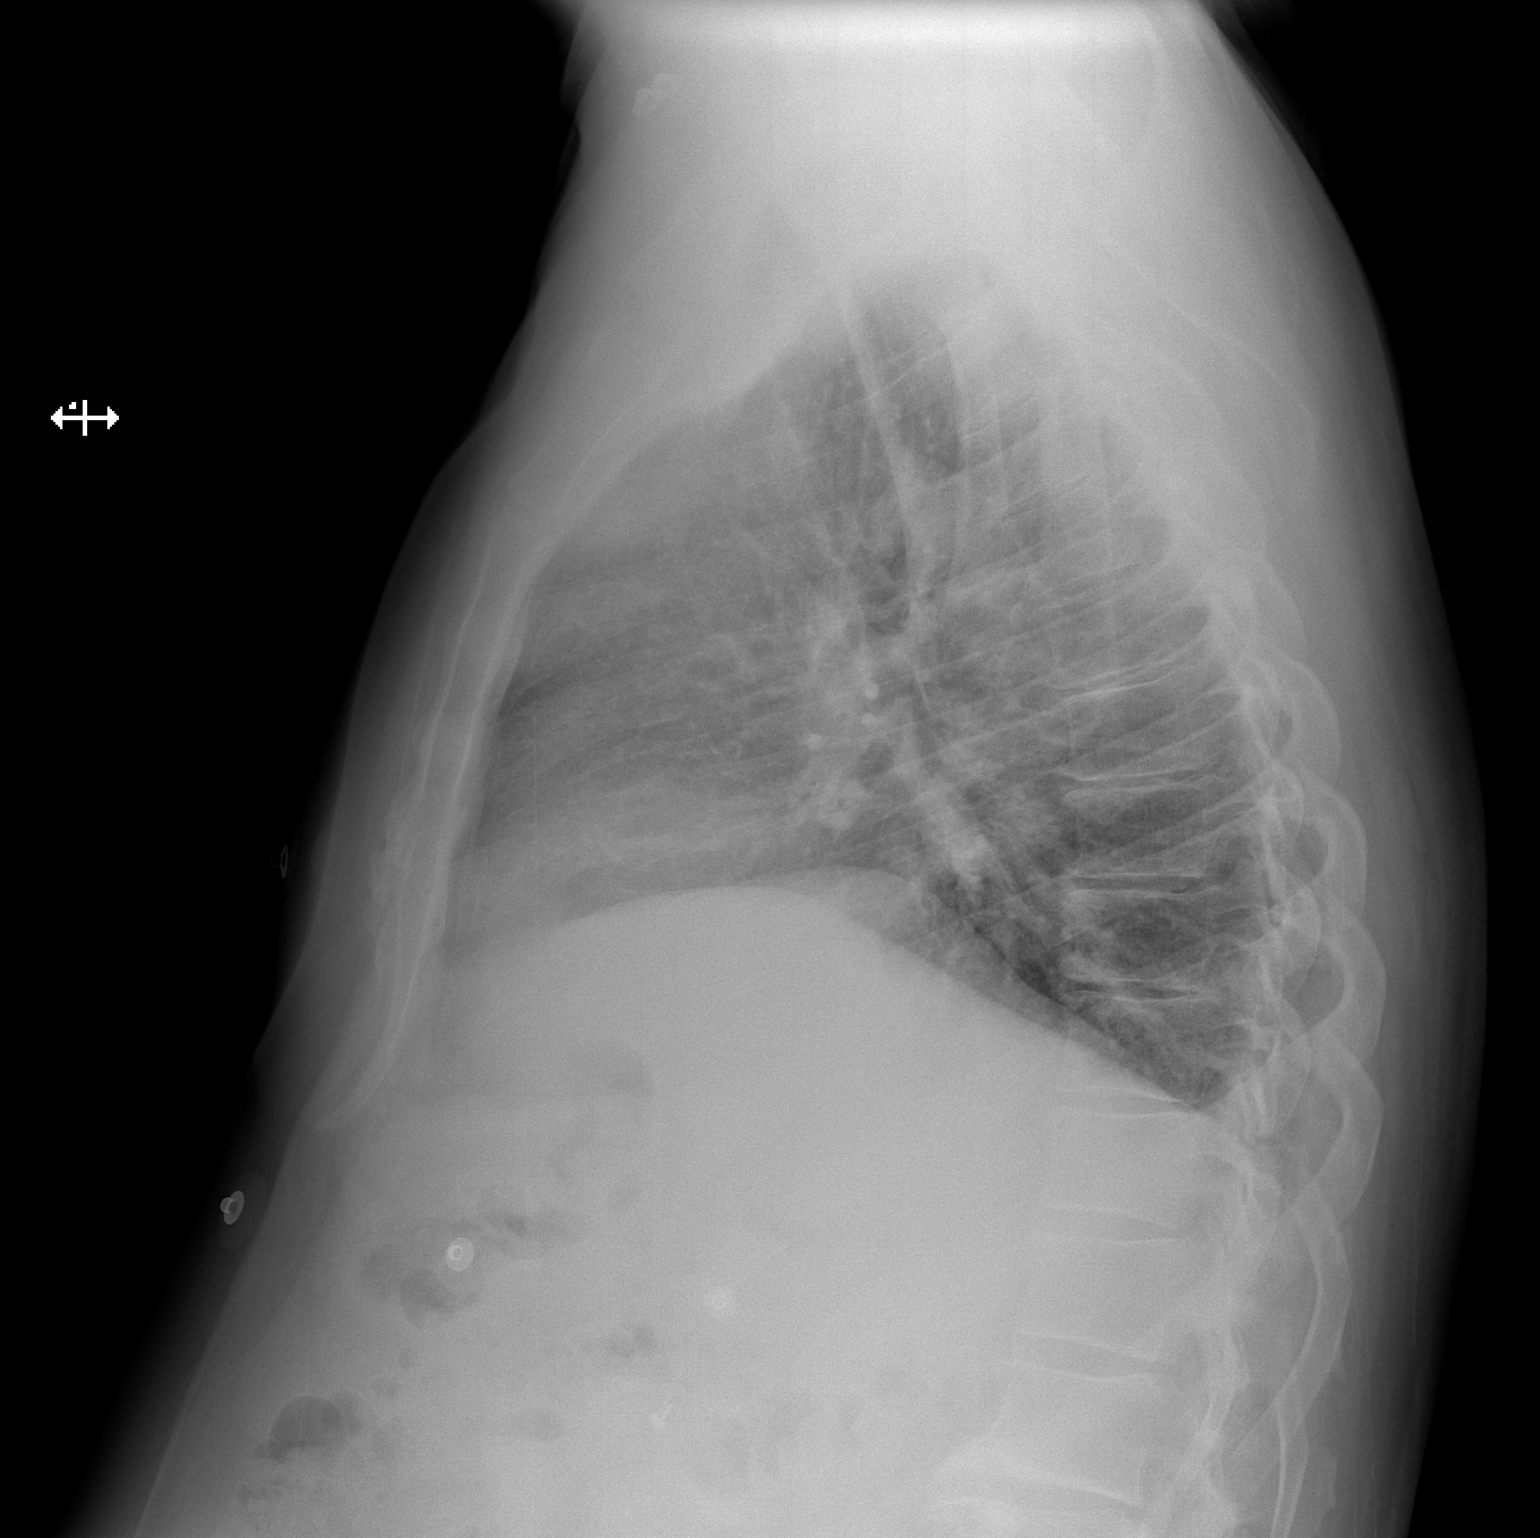

[2 of 2 positions shown; findings below may reference images not displayed]

FINDINGS: The heart size and mediastinal contours are within normal limits.
Both lungs are clear. The visualized skeletal structures are
unremarkable.
IMPRESSION: No active cardiopulmonary disease.

## 2017-01-20 IMAGING — CR DG CHEST 1V PORT
1 series · 1 of 1 positions shown · non-contrast
Comparison: 09/17/2015 and earlier.

CLINICAL DATA: 59-year-old male status post CABG. Initial
encounter.

EXAM:
PORTABLE CHEST 1 VIEW

[AP]
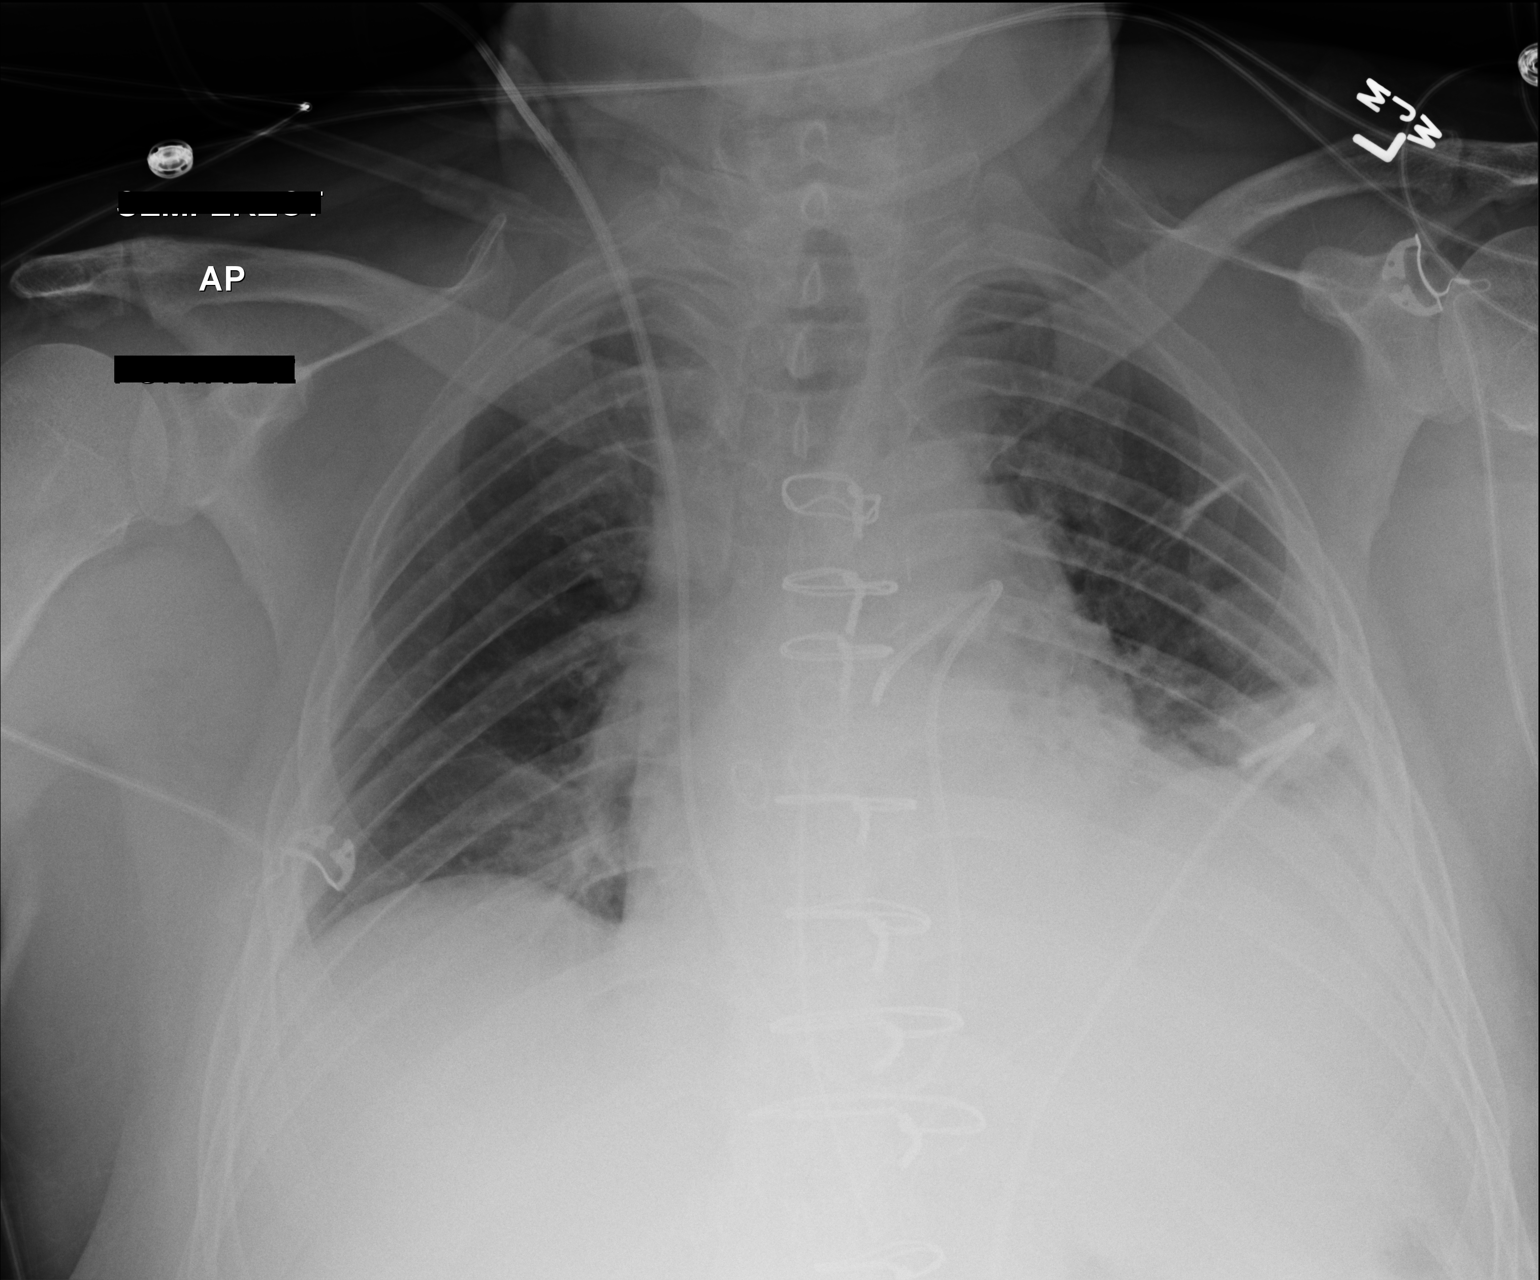

[1 of 1 positions shown; findings below may reference images not displayed]

FINDINGS: Extubated. Enteric tube removed. Left chest tube and mediastinal
drain remain in place. Right IJ approach Swan-Ganz catheter remains
in place. As before, this appears somewhat looped in the left main
pulmonary artery region.

Mildly lower lung volumes. Increased patchy opacity at the left
base, linear atelectasis in the left mid lung. No pneumothorax or
pulmonary edema. Probable small left effusion. Stable cardiac size
and mediastinal contours. Sequelae of CABG.
IMPRESSION: 1. Extubated and enteric tube removed. Mildly lower lung volumes and
increased left lung atelectasis.
2.  Otherwise, stable lines and tubes.
3. No pneumothorax or edema.  Small left effusion.

## 2017-01-24 ENCOUNTER — Other Ambulatory Visit: Payer: Self-pay | Admitting: Cardiovascular Disease

## 2017-01-24 DIAGNOSIS — Z951 Presence of aortocoronary bypass graft: Secondary | ICD-10-CM

## 2017-01-24 DIAGNOSIS — I2510 Atherosclerotic heart disease of native coronary artery without angina pectoris: Secondary | ICD-10-CM

## 2017-03-01 DIAGNOSIS — Z86718 Personal history of other venous thrombosis and embolism: Secondary | ICD-10-CM

## 2017-03-01 DIAGNOSIS — C7989 Secondary malignant neoplasm of other specified sites: Secondary | ICD-10-CM

## 2017-03-01 DIAGNOSIS — C7800 Secondary malignant neoplasm of unspecified lung: Secondary | ICD-10-CM | POA: Diagnosis not present

## 2017-03-01 DIAGNOSIS — N189 Chronic kidney disease, unspecified: Secondary | ICD-10-CM

## 2017-03-01 DIAGNOSIS — C419 Malignant neoplasm of bone and articular cartilage, unspecified: Secondary | ICD-10-CM | POA: Diagnosis not present

## 2017-03-01 DIAGNOSIS — G893 Neoplasm related pain (acute) (chronic): Secondary | ICD-10-CM | POA: Diagnosis not present

## 2017-03-01 DIAGNOSIS — Z7901 Long term (current) use of anticoagulants: Secondary | ICD-10-CM

## 2017-03-04 ENCOUNTER — Ambulatory Visit (INDEPENDENT_AMBULATORY_CARE_PROVIDER_SITE_OTHER): Payer: Managed Care, Other (non HMO) | Admitting: Cardiovascular Disease

## 2017-03-04 ENCOUNTER — Encounter: Payer: Self-pay | Admitting: Cardiovascular Disease

## 2017-03-04 VITALS — BP 130/70 | HR 80 | Ht 70.0 in | Wt 222.8 lb

## 2017-03-04 DIAGNOSIS — I25810 Atherosclerosis of coronary artery bypass graft(s) without angina pectoris: Secondary | ICD-10-CM | POA: Diagnosis not present

## 2017-03-04 DIAGNOSIS — I493 Ventricular premature depolarization: Secondary | ICD-10-CM | POA: Diagnosis not present

## 2017-03-04 DIAGNOSIS — I10 Essential (primary) hypertension: Secondary | ICD-10-CM

## 2017-03-04 NOTE — Patient Instructions (Signed)

## 2017-03-04 NOTE — Progress Notes (Signed)
Cardiology Office Note Date:  03/07/2017   ID:  Timothy Gentry, Limb [birth year], MRN [id number]  PCP:  Derinda Late, MD  Cardiologist:  Sherren Mocha, MD    Chief Complaint  Patient presents with  . Coronary Artery Disease     History of Present Illness: Leray Garverick is a 61 y.o. male who presents for follow-up evaluation. The patient has been followed for coronary artery disease. He has a history of non-ST elevation MI in 2016 and ultimately was treated with 4 vessel CABG. His other medical problems have included hypertension, hyperlipidemia, type 2 diabetes, and chronic kidney disease. The patient had a good recovery after CABG. 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 He was hospitalized at [facility] for about 45 days. He's had a prolonged recovery. He recently underwent evaluation for consideration of methadone treatment for his chronic pain. He was found to be in ventricular bigeminy and was referred for further evaluation.  From a cardiac perspective, the patient has been doing remarkably well. He denies chest pain, chest pressure, shortness of breath, edema, orthopnea, PND, or heart palpitations. He's been noted to have frequent PVCs on his EKG tracings over the last year. He really has otherwise been asymptomatic.   Past Medical History:  Diagnosis Date  . 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 Cancer ([place])    skin  . Chronic kidney disease    Chronic, Stage III  . Colon polyps   . Colon ulcer    Possible NSAID induced  . Coronary artery disease 09/15/2015  . Degenerative disc disease, cervical   . Diabetes mellitus without complication ([facility])    Type II  . Hematuria   . Hyperlipidemia   . Hypertension   . NSTEMI (non-ST elevated myocardial infarction) ([place]) 09/15/2015  . 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 CARDIAC CATHETERIZATION N/A 09/15/2015   Procedure: Left Heart Cath and Coronary Angiography;  Surgeon: Peter M Martinique, MD;  Location: [facility];  Service: Cardiovascular;  Laterality: N/A;  . COLONOSCOPY  09/18/2009  . COLONOSCOPY N/A 09/24/2014   Procedure: COLONOSCOPY;  Surgeon: Mayme Genta, MD;  Location: [facility];  Service: Endoscopy;  Laterality: N/A;  . CORONARY ARTERY BYPASS GRAFT N/A 09/17/2015   Procedure: CORONARY ARTERY BYPASS GRAFTING (CABG);  Surgeon: Rexene Alberts, MD;  Location: [facility];  Service: Open Heart Surgery;  Laterality: N/A;  . DISTAL PANCREATECTOMY     benign neuroendocrine tumor  . ESOPHAGOGASTRODUODENOSCOPY N/A 09/24/2014   Procedure: ESOPHAGOGASTRODUODENOSCOPY (EGD);  Surgeon: Mayme Genta, MD;  Location: [facility];  Service: Endoscopy;  Laterality: N/A;  . KNEE SURGERY Right 1983   Open repair of meniscus of knee  . LUMBAR FUSION  2002  . LUMBAR LAMINECTOMY  2001  . REVISION TOTAL HIP ARTHROPLASTY  2010  . SPLENECTOMY    . TEE WITHOUT CARDIOVERSION N/A 09/17/2015   Procedure: TRANSESOPHAGEAL ECHOCARDIOGRAM (TEE);  Surgeon: Rexene Alberts, MD;  Location: [facility];  Service: Open Heart Surgery;  Laterality: N/A;    Current Outpatient Prescriptions  Medication Sig Dispense Refill  . acetaminophen (TYLENOL) 500 MG tablet Take 500 mg by mouth 2 (two) times daily.    . Calcium  Carbonate-Vitamin D (CALCIUM 600+D) 600-400 MG-UNIT tablet Take 1 tablet by mouth daily.    Marland Kitchen ezetimibe (ZETIA) 10 MG tablet Take 10 mg by mouth daily.    . hydrochlorothiazide (HYDRODIURIL) 12.5 MG tablet Take 12.5 mg by mouth daily.  11  . losartan (COZAAR) 50 MG tablet TAKE 1 TABLET(50 MG) BY MOUTH DAILY 30 tablet 1  . metoprolol tartrate (LOPRESSOR) 25 MG tablet Take 1 tablet (25 mg total) by mouth 2 (two) times daily. 60 tablet 6  . Multiple Vitamin (MULTIVITAMIN) tablet Take 1  tablet by mouth daily.    . pravastatin (PRAVACHOL) 80 MG tablet Take 80 mg by mouth every evening.     . senna-docusate (SENOKOT-S) 8.6-50 MG tablet Take 1 tablet by mouth daily.    Alveda Reasons 15 MG TABS tablet Take 15 mg by mouth daily.     No current facility-administered medications for this visit.     Allergies:   Celebrex [celecoxib]   Social History:  The patient  reports that he quit smoking about 37 years ago. His smoking use included Cigarettes. He has never used smokeless tobacco. He reports that he drinks alcohol. He reports that he does not use drugs.   Family History:  The patient's  family history includes Cancer in his father.    ROS:  Please see the history of present illness.  Otherwise, review of systems is positive for Back pain, gait unsteadiness, weakness.  All other systems are reviewed and negative.    PHYSICAL EXAM: VS:  BP 130/70   Pulse 80   Ht 5\' 10"  (1.778 m)   Wt 222 lb 12.8 oz (101.1 kg)   SpO2 95%   BMI 31.97 kg/m  , BMI Body mass index is 31.97 kg/m. GEN: Well nourished, well developed, in no acute distress  HEENT: normal  Neck: no JVD, no masses. No carotid bruits Cardiac: RRR without murmur or gallop                Respiratory:  clear to auscultation bilaterally, normal work of breathing GI: soft, nontender, nondistended, + BS MS: no deformity or atrophy  Ext: no pretibial edema, pedal pulses 2+= bilaterally Skin: warm and dry, no rash Neuro:  Strength and sensation are intact Psych: euthymic mood, full affect  EKG:  EKG is not ordered today. Recent EKG dated 03/01/2017 is reviewed and demonstrates normal sinus rhythm with nonspecific T-wave abnormality and a pattern of ventricular bigeminy.  Recent Labs: No results found for requested labs within last 8760 hours.   Lipid Panel     Component Value Date/Time   CHOL 141 09/16/2015 0245   TRIG 117 09/16/2015 0245   HDL 34 (L) 09/16/2015 0245   CHOLHDL 4.1 09/16/2015 0245   VLDL 23  09/16/2015 0245   LDLCALC 84 09/16/2015 0245      Wt Readings from Last 3 Encounters:  03/04/17 222 lb 12.8 oz (101.1 kg)  02/10/16 218 lb 1.9 oz (98.9 kg)  01/27/16 242 lb (109.8 kg)     ASSESSMENT AND PLAN: 1.  CAD status post CABG: No symptoms of angina. The patient appears stable. His medications are reviewed and include Xarelto for anticoagulation (DVT), metoprolol succinate, and losartan. No changes are recommended today. No antiplatelet therapy is indicated in the setting of chronic anticoagulation in a patient with stable coronary artery disease.  2. Ventricular bigeminy: The patient is completely asymptomatic. I have recommended an echocardiogram to rule out any change in his LV function which could  precipitate increased ventricular ectopy. Otherwise I don't think further testing is needed at this time. I think it would be okay for him to move forward with his plans for methadone for chronic pain as indicated. I don't see any cardiac contraindication to this at the present time.  3. Hyperlipidemia: Managed by Dr. Sandi Mariscal. The patient is taking zetia 10 mg daily with a history of statin intolerance.  4. Hypertension: Blood pressure is well controlled on a combination of losartan and metoprolol succinate.  Current medicines are reviewed with the patient today.  The patient does not have concerns regarding medicines.  Labs/ tests ordered today include:   Orders Placed This Encounter  Procedures  . ECHOCARDIOGRAM COMPLETE    Disposition:   FU one year  Signed, Sherren Mocha, MD  03/07/2017 9:21 AM    [facility] [place], [place], [place]  [address] Phone: [phone]; Fax: [phone]

## 2017-03-15 ENCOUNTER — Other Ambulatory Visit: Payer: Self-pay

## 2017-03-15 ENCOUNTER — Ambulatory Visit (HOSPITAL_COMMUNITY): Payer: Managed Care, Other (non HMO) | Attending: Cardiovascular Disease

## 2017-03-15 DIAGNOSIS — I348 Other nonrheumatic mitral valve disorders: Secondary | ICD-10-CM | POA: Diagnosis not present

## 2017-03-15 DIAGNOSIS — E119 Type 2 diabetes mellitus without complications: Secondary | ICD-10-CM | POA: Diagnosis not present

## 2017-03-15 DIAGNOSIS — E785 Hyperlipidemia, unspecified: Secondary | ICD-10-CM | POA: Diagnosis not present

## 2017-03-15 DIAGNOSIS — I493 Ventricular premature depolarization: Secondary | ICD-10-CM | POA: Diagnosis present

## 2017-03-15 DIAGNOSIS — Z951 Presence of aortocoronary bypass graft: Secondary | ICD-10-CM | POA: Diagnosis not present

## 2017-03-15 DIAGNOSIS — I119 Hypertensive heart disease without heart failure: Secondary | ICD-10-CM | POA: Insufficient documentation

## 2017-03-15 DIAGNOSIS — I252 Old myocardial infarction: Secondary | ICD-10-CM | POA: Diagnosis not present

## 2017-06-01 DIAGNOSIS — Z7901 Long term (current) use of anticoagulants: Secondary | ICD-10-CM | POA: Diagnosis not present

## 2017-06-01 DIAGNOSIS — Z86718 Personal history of other venous thrombosis and embolism: Secondary | ICD-10-CM | POA: Diagnosis not present

## 2017-06-01 DIAGNOSIS — Z9081 Acquired absence of spleen: Secondary | ICD-10-CM | POA: Diagnosis not present

## 2017-06-01 DIAGNOSIS — M25552 Pain in left hip: Secondary | ICD-10-CM | POA: Diagnosis not present

## 2017-06-01 DIAGNOSIS — C7800 Secondary malignant neoplasm of unspecified lung: Secondary | ICD-10-CM | POA: Diagnosis not present

## 2017-06-01 DIAGNOSIS — C7989 Secondary malignant neoplasm of other specified sites: Secondary | ICD-10-CM | POA: Diagnosis not present

## 2017-06-01 DIAGNOSIS — C419 Malignant neoplasm of bone and articular cartilage, unspecified: Secondary | ICD-10-CM | POA: Diagnosis not present

## 2017-06-01 DIAGNOSIS — Z8507 Personal history of malignant neoplasm of pancreas: Secondary | ICD-10-CM | POA: Diagnosis not present

## 2017-06-01 DIAGNOSIS — N189 Chronic kidney disease, unspecified: Secondary | ICD-10-CM | POA: Diagnosis not present

## 2017-06-01 DIAGNOSIS — G893 Neoplasm related pain (acute) (chronic): Secondary | ICD-10-CM | POA: Diagnosis not present

## 2017-11-24 DIAGNOSIS — Z9081 Acquired absence of spleen: Secondary | ICD-10-CM | POA: Diagnosis not present

## 2017-11-24 DIAGNOSIS — C7989 Secondary malignant neoplasm of other specified sites: Secondary | ICD-10-CM | POA: Diagnosis not present

## 2017-11-24 DIAGNOSIS — C419 Malignant neoplasm of bone and articular cartilage, unspecified: Secondary | ICD-10-CM

## 2017-11-24 DIAGNOSIS — E611 Iron deficiency: Secondary | ICD-10-CM | POA: Diagnosis not present

## 2018-02-14 DIAGNOSIS — N189 Chronic kidney disease, unspecified: Secondary | ICD-10-CM

## 2018-02-14 DIAGNOSIS — Z90411 Acquired partial absence of pancreas: Secondary | ICD-10-CM

## 2018-02-14 DIAGNOSIS — R7989 Other specified abnormal findings of blood chemistry: Secondary | ICD-10-CM

## 2018-02-14 DIAGNOSIS — Z86718 Personal history of other venous thrombosis and embolism: Secondary | ICD-10-CM

## 2018-02-14 DIAGNOSIS — C419 Malignant neoplasm of bone and articular cartilage, unspecified: Secondary | ICD-10-CM

## 2018-02-14 DIAGNOSIS — K603 Anal fistula: Secondary | ICD-10-CM | POA: Diagnosis not present

## 2018-02-14 DIAGNOSIS — Z8507 Personal history of malignant neoplasm of pancreas: Secondary | ICD-10-CM | POA: Diagnosis not present

## 2018-02-14 DIAGNOSIS — C7989 Secondary malignant neoplasm of other specified sites: Secondary | ICD-10-CM | POA: Diagnosis not present

## 2018-02-14 DIAGNOSIS — C7800 Secondary malignant neoplasm of unspecified lung: Secondary | ICD-10-CM | POA: Diagnosis not present

## 2018-02-14 DIAGNOSIS — Z9081 Acquired absence of spleen: Secondary | ICD-10-CM | POA: Diagnosis not present

## 2018-02-14 DIAGNOSIS — D509 Iron deficiency anemia, unspecified: Secondary | ICD-10-CM | POA: Diagnosis not present

## 2018-05-24 DIAGNOSIS — Z90411 Acquired partial absence of pancreas: Secondary | ICD-10-CM

## 2018-05-24 DIAGNOSIS — D6959 Other secondary thrombocytopenia: Secondary | ICD-10-CM

## 2018-05-24 DIAGNOSIS — Z86718 Personal history of other venous thrombosis and embolism: Secondary | ICD-10-CM

## 2018-05-24 DIAGNOSIS — C7989 Secondary malignant neoplasm of other specified sites: Secondary | ICD-10-CM | POA: Diagnosis not present

## 2018-05-24 DIAGNOSIS — K603 Anal fistula: Secondary | ICD-10-CM

## 2018-05-24 DIAGNOSIS — N189 Chronic kidney disease, unspecified: Secondary | ICD-10-CM

## 2018-05-24 DIAGNOSIS — Z9081 Acquired absence of spleen: Secondary | ICD-10-CM

## 2018-05-24 DIAGNOSIS — C419 Malignant neoplasm of bone and articular cartilage, unspecified: Secondary | ICD-10-CM | POA: Diagnosis not present

## 2018-05-24 DIAGNOSIS — Z8507 Personal history of malignant neoplasm of pancreas: Secondary | ICD-10-CM

## 2018-05-24 DIAGNOSIS — C7800 Secondary malignant neoplasm of unspecified lung: Secondary | ICD-10-CM | POA: Diagnosis not present

## 2018-05-24 DIAGNOSIS — D509 Iron deficiency anemia, unspecified: Secondary | ICD-10-CM | POA: Diagnosis not present

## 2018-09-28 DIAGNOSIS — C7989 Secondary malignant neoplasm of other specified sites: Secondary | ICD-10-CM

## 2018-09-28 DIAGNOSIS — R7989 Other specified abnormal findings of blood chemistry: Secondary | ICD-10-CM

## 2018-09-28 DIAGNOSIS — N189 Chronic kidney disease, unspecified: Secondary | ICD-10-CM

## 2018-09-28 DIAGNOSIS — D509 Iron deficiency anemia, unspecified: Secondary | ICD-10-CM

## 2018-09-28 DIAGNOSIS — Z9081 Acquired absence of spleen: Secondary | ICD-10-CM | POA: Diagnosis not present

## 2018-09-28 DIAGNOSIS — C419 Malignant neoplasm of bone and articular cartilage, unspecified: Secondary | ICD-10-CM

## 2018-09-28 DIAGNOSIS — C7800 Secondary malignant neoplasm of unspecified lung: Secondary | ICD-10-CM | POA: Diagnosis not present

## 2018-09-28 DIAGNOSIS — Z7901 Long term (current) use of anticoagulants: Secondary | ICD-10-CM

## 2018-09-28 DIAGNOSIS — Z90411 Acquired partial absence of pancreas: Secondary | ICD-10-CM

## 2018-09-28 DIAGNOSIS — Z8507 Personal history of malignant neoplasm of pancreas: Secondary | ICD-10-CM

## 2018-09-28 DIAGNOSIS — Z86718 Personal history of other venous thrombosis and embolism: Secondary | ICD-10-CM

## 2018-09-28 DIAGNOSIS — K603 Anal fistula: Secondary | ICD-10-CM

## 2018-10-25 DIAGNOSIS — Z8507 Personal history of malignant neoplasm of pancreas: Secondary | ICD-10-CM

## 2018-10-25 DIAGNOSIS — C419 Malignant neoplasm of bone and articular cartilage, unspecified: Secondary | ICD-10-CM | POA: Diagnosis not present

## 2018-10-25 DIAGNOSIS — R7989 Other specified abnormal findings of blood chemistry: Secondary | ICD-10-CM

## 2018-10-25 DIAGNOSIS — N189 Chronic kidney disease, unspecified: Secondary | ICD-10-CM

## 2018-10-25 DIAGNOSIS — C7800 Secondary malignant neoplasm of unspecified lung: Secondary | ICD-10-CM

## 2018-10-25 DIAGNOSIS — Z7901 Long term (current) use of anticoagulants: Secondary | ICD-10-CM

## 2018-10-25 DIAGNOSIS — K603 Anal fistula: Secondary | ICD-10-CM

## 2018-10-25 DIAGNOSIS — Z90411 Acquired partial absence of pancreas: Secondary | ICD-10-CM

## 2018-10-25 DIAGNOSIS — D509 Iron deficiency anemia, unspecified: Secondary | ICD-10-CM

## 2018-10-25 DIAGNOSIS — Z86718 Personal history of other venous thrombosis and embolism: Secondary | ICD-10-CM

## 2018-10-25 DIAGNOSIS — Z9081 Acquired absence of spleen: Secondary | ICD-10-CM

## 2018-10-25 DIAGNOSIS — C7989 Secondary malignant neoplasm of other specified sites: Secondary | ICD-10-CM

## 2018-11-23 DIAGNOSIS — C419 Malignant neoplasm of bone and articular cartilage, unspecified: Secondary | ICD-10-CM

## 2018-11-23 DIAGNOSIS — C7989 Secondary malignant neoplasm of other specified sites: Secondary | ICD-10-CM

## 2018-11-23 DIAGNOSIS — C7800 Secondary malignant neoplasm of unspecified lung: Secondary | ICD-10-CM

## 2019-02-22 DIAGNOSIS — C419 Malignant neoplasm of bone and articular cartilage, unspecified: Secondary | ICD-10-CM

## 2019-02-22 DIAGNOSIS — C7989 Secondary malignant neoplasm of other specified sites: Secondary | ICD-10-CM | POA: Diagnosis not present

## 2019-02-22 DIAGNOSIS — C7800 Secondary malignant neoplasm of unspecified lung: Secondary | ICD-10-CM | POA: Diagnosis not present

## 2019-03-22 DIAGNOSIS — C7989 Secondary malignant neoplasm of other specified sites: Secondary | ICD-10-CM

## 2019-03-22 DIAGNOSIS — C7800 Secondary malignant neoplasm of unspecified lung: Secondary | ICD-10-CM

## 2019-03-22 DIAGNOSIS — C419 Malignant neoplasm of bone and articular cartilage, unspecified: Secondary | ICD-10-CM

## 2019-04-19 DIAGNOSIS — C419 Malignant neoplasm of bone and articular cartilage, unspecified: Secondary | ICD-10-CM

## 2019-04-19 DIAGNOSIS — C7800 Secondary malignant neoplasm of unspecified lung: Secondary | ICD-10-CM

## 2019-04-19 DIAGNOSIS — D649 Anemia, unspecified: Secondary | ICD-10-CM

## 2019-05-07 DIAGNOSIS — D649 Anemia, unspecified: Secondary | ICD-10-CM | POA: Diagnosis not present

## 2019-05-07 DIAGNOSIS — C419 Malignant neoplasm of bone and articular cartilage, unspecified: Secondary | ICD-10-CM | POA: Diagnosis not present

## 2019-05-07 DIAGNOSIS — C7800 Secondary malignant neoplasm of unspecified lung: Secondary | ICD-10-CM | POA: Diagnosis not present

## 2019-05-10 DIAGNOSIS — C7800 Secondary malignant neoplasm of unspecified lung: Secondary | ICD-10-CM

## 2019-05-10 DIAGNOSIS — D709 Neutropenia, unspecified: Secondary | ICD-10-CM

## 2019-05-10 DIAGNOSIS — E860 Dehydration: Secondary | ICD-10-CM

## 2019-05-10 DIAGNOSIS — C419 Malignant neoplasm of bone and articular cartilage, unspecified: Secondary | ICD-10-CM

## 2019-05-10 DIAGNOSIS — R740 Nonspecific elevation of levels of transaminase and lactic acid dehydrogenase [LDH]: Secondary | ICD-10-CM

## 2019-05-10 DIAGNOSIS — D649 Anemia, unspecified: Secondary | ICD-10-CM

## 2019-05-17 MED ORDER — ONDANSETRON HCL 8 MG PO TABS
8.00 | ORAL_TABLET | ORAL | Status: DC
Start: ? — End: 2019-05-17

## 2019-05-17 MED ORDER — GENERIC EXTERNAL MEDICATION
2.00 | Status: DC
Start: 2019-05-21 — End: 2019-05-17

## 2019-05-17 MED ORDER — LOSARTAN POTASSIUM 25 MG PO TABS
25.00 | ORAL_TABLET | ORAL | Status: DC
Start: 2019-05-22 — End: 2019-05-17

## 2019-05-17 MED ORDER — ZOLPIDEM TARTRATE 5 MG PO TABS
5.00 | ORAL_TABLET | ORAL | Status: DC
Start: ? — End: 2019-05-17

## 2019-05-17 MED ORDER — GLUCOSE 40 % PO GEL
15.00 | ORAL | Status: DC
Start: ? — End: 2019-05-17

## 2019-05-17 MED ORDER — INSULIN LISPRO 100 UNIT/ML ~~LOC~~ SOLN
2.00 | SUBCUTANEOUS | Status: DC
Start: 2019-05-21 — End: 2019-05-17

## 2019-05-17 MED ORDER — METOPROLOL SUCCINATE ER 25 MG PO TB24
25.00 | ORAL_TABLET | ORAL | Status: DC
Start: 2019-05-22 — End: 2019-05-17

## 2019-05-17 MED ORDER — DEXTROSE 10 % IV SOLN
125.00 | INTRAVENOUS | Status: DC
Start: ? — End: 2019-05-17

## 2019-05-17 MED ORDER — PANTOPRAZOLE SODIUM 40 MG PO TBEC
40.00 | DELAYED_RELEASE_TABLET | ORAL | Status: DC
Start: 2019-05-22 — End: 2019-05-17

## 2019-05-17 MED ORDER — PRAVASTATIN SODIUM 40 MG PO TABS
80.00 | ORAL_TABLET | ORAL | Status: DC
Start: 2019-05-21 — End: 2019-05-17

## 2019-05-17 MED ORDER — PROCHLORPERAZINE MALEATE 5 MG PO TABS
10.00 | ORAL_TABLET | ORAL | Status: DC
Start: ? — End: 2019-05-17

## 2019-05-17 MED ORDER — K-PHOS-NEUTRAL 155-852-130 MG PO TABS
250.00 | ORAL_TABLET | ORAL | Status: DC
Start: 2019-05-17 — End: 2019-05-17

## 2019-05-17 MED ORDER — GENERIC EXTERNAL MEDICATION
1.00 | Status: DC
Start: 2019-05-21 — End: 2019-05-17

## 2019-05-17 MED ORDER — RIVAROXABAN 15 MG PO TABS
15.00 | ORAL_TABLET | ORAL | Status: DC
Start: 2019-05-18 — End: 2019-05-17

## 2019-05-17 MED ORDER — DEXTROSE IN LACTATED RINGERS 5 % IV SOLN
INTRAVENOUS | Status: DC
Start: ? — End: 2019-05-17

## 2019-05-17 MED ORDER — LIDOCAINE 4 % EX PTCH
1.00 | MEDICATED_PATCH | CUTANEOUS | Status: DC
Start: 2019-05-22 — End: 2019-05-17

## 2019-05-21 MED ORDER — INSULIN GLARGINE 100 UNIT/ML SOLOSTAR PEN
5.00 | PEN_INJECTOR | SUBCUTANEOUS | Status: DC
Start: 2019-05-21 — End: 2019-05-21

## 2019-05-21 MED ORDER — RIVAROXABAN 15 MG PO TABS
15.00 | ORAL_TABLET | ORAL | Status: DC
Start: 2019-05-22 — End: 2019-05-21

## 2019-05-21 MED ORDER — GENERIC EXTERNAL MEDICATION
Status: DC
Start: ? — End: 2019-05-21

## 2019-05-24 DIAGNOSIS — D649 Anemia, unspecified: Secondary | ICD-10-CM

## 2019-05-24 DIAGNOSIS — C419 Malignant neoplasm of bone and articular cartilage, unspecified: Secondary | ICD-10-CM | POA: Diagnosis not present

## 2019-05-24 DIAGNOSIS — C7800 Secondary malignant neoplasm of unspecified lung: Secondary | ICD-10-CM

## 2019-06-28 DIAGNOSIS — D649 Anemia, unspecified: Secondary | ICD-10-CM

## 2019-06-28 DIAGNOSIS — C419 Malignant neoplasm of bone and articular cartilage, unspecified: Secondary | ICD-10-CM

## 2019-06-28 DIAGNOSIS — C7800 Secondary malignant neoplasm of unspecified lung: Secondary | ICD-10-CM

## 2019-07-05 DIAGNOSIS — C419 Malignant neoplasm of bone and articular cartilage, unspecified: Secondary | ICD-10-CM

## 2019-07-05 DIAGNOSIS — C7800 Secondary malignant neoplasm of unspecified lung: Secondary | ICD-10-CM

## 2019-07-05 DIAGNOSIS — D649 Anemia, unspecified: Secondary | ICD-10-CM

## 2019-08-12 DEATH — deceased
